# Patient Record
Sex: Female | Born: 1985 | Race: White | Hispanic: No | Marital: Single | Smoking: Current every day smoker
Health system: Southern US, Community
[De-identification: ages and names within clinical notes are randomized; demographics above are authoritative.]

## PROBLEM LIST (undated history)

## (undated) DIAGNOSIS — D649 Anemia, unspecified: Secondary | ICD-10-CM

## (undated) DIAGNOSIS — B159 Hepatitis A without hepatic coma: Secondary | ICD-10-CM

## (undated) DIAGNOSIS — E039 Hypothyroidism, unspecified: Secondary | ICD-10-CM

## (undated) DIAGNOSIS — N760 Acute vaginitis: Secondary | ICD-10-CM

## (undated) DIAGNOSIS — I82409 Acute embolism and thrombosis of unspecified deep veins of unspecified lower extremity: Secondary | ICD-10-CM

## (undated) DIAGNOSIS — K8020 Calculus of gallbladder without cholecystitis without obstruction: Secondary | ICD-10-CM

## (undated) DIAGNOSIS — M797 Fibromyalgia: Secondary | ICD-10-CM

## (undated) DIAGNOSIS — F419 Anxiety disorder, unspecified: Secondary | ICD-10-CM

## (undated) DIAGNOSIS — E079 Disorder of thyroid, unspecified: Secondary | ICD-10-CM

## (undated) DIAGNOSIS — G43009 Migraine without aura, not intractable, without status migrainosus: Secondary | ICD-10-CM

## (undated) DIAGNOSIS — O26879 Cervical shortening, unspecified trimester: Secondary | ICD-10-CM

## (undated) DIAGNOSIS — B9689 Other specified bacterial agents as the cause of diseases classified elsewhere: Secondary | ICD-10-CM

## (undated) DIAGNOSIS — Z8249 Family history of ischemic heart disease and other diseases of the circulatory system: Secondary | ICD-10-CM

## (undated) HISTORY — DX: Migraine without aura, not intractable, without status migrainosus: G43.009

## (undated) HISTORY — DX: Cervical shortening, unspecified trimester: O26.879

## (undated) HISTORY — DX: Calculus of gallbladder without cholecystitis without obstruction: K80.20

## (undated) HISTORY — PX: CHOLECYSTECTOMY, LAPAROSCOPIC: SHX56

## (undated) HISTORY — PX: CHOLECYSTECTOMY: SHX55

## (undated) HISTORY — DX: Family history of ischemic heart disease and other diseases of the circulatory system: Z82.49

## (undated) HISTORY — PX: WISDOM TOOTH EXTRACTION: SHX21

## (undated) HISTORY — DX: Anemia, unspecified: D64.9

---
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
## 2004-02-18 ENCOUNTER — Emergency Department (HOSPITAL_COMMUNITY): Admission: EM | Admit: 2004-02-18 | Discharge: 2004-02-18 | Payer: Self-pay

## 2005-07-18 ENCOUNTER — Emergency Department (HOSPITAL_COMMUNITY): Admission: EM | Admit: 2005-07-18 | Discharge: 2005-07-18 | Payer: Self-pay | Admitting: Emergency Medicine

## 2005-11-07 ENCOUNTER — Inpatient Hospital Stay (HOSPITAL_COMMUNITY): Admission: AD | Admit: 2005-11-07 | Discharge: 2005-11-07 | Payer: Self-pay | Admitting: Obstetrics and Gynecology

## 2005-11-28 ENCOUNTER — Inpatient Hospital Stay (HOSPITAL_COMMUNITY): Admission: AD | Admit: 2005-11-28 | Discharge: 2005-11-28 | Payer: Self-pay | Admitting: *Deleted

## 2006-01-15 ENCOUNTER — Encounter (INDEPENDENT_AMBULATORY_CARE_PROVIDER_SITE_OTHER): Payer: Self-pay | Admitting: *Deleted

## 2006-01-15 ENCOUNTER — Ambulatory Visit: Payer: Self-pay | Admitting: Family Medicine

## 2006-01-15 ENCOUNTER — Other Ambulatory Visit: Admission: RE | Admit: 2006-01-15 | Discharge: 2006-01-15 | Payer: Self-pay | Admitting: Family Medicine

## 2006-01-18 ENCOUNTER — Inpatient Hospital Stay (HOSPITAL_COMMUNITY): Admission: AD | Admit: 2006-01-18 | Discharge: 2006-01-18 | Payer: Self-pay | Admitting: Obstetrics and Gynecology

## 2006-02-04 ENCOUNTER — Ambulatory Visit (HOSPITAL_COMMUNITY): Admission: RE | Admit: 2006-02-04 | Discharge: 2006-02-04 | Payer: Self-pay | Admitting: Obstetrics and Gynecology

## 2006-02-14 ENCOUNTER — Inpatient Hospital Stay (HOSPITAL_COMMUNITY): Admission: AD | Admit: 2006-02-14 | Discharge: 2006-02-14 | Payer: Self-pay | Admitting: Obstetrics and Gynecology

## 2006-02-14 ENCOUNTER — Ambulatory Visit: Payer: Self-pay | Admitting: Obstetrics and Gynecology

## 2006-03-03 ENCOUNTER — Ambulatory Visit: Payer: Self-pay | Admitting: Certified Nurse Midwife

## 2006-03-03 ENCOUNTER — Inpatient Hospital Stay (HOSPITAL_COMMUNITY): Admission: AD | Admit: 2006-03-03 | Discharge: 2006-03-03 | Payer: Self-pay | Admitting: Family Medicine

## 2006-03-19 ENCOUNTER — Ambulatory Visit: Payer: Self-pay | Admitting: *Deleted

## 2006-04-02 ENCOUNTER — Ambulatory Visit: Payer: Self-pay | Admitting: Gynecology

## 2006-04-23 ENCOUNTER — Ambulatory Visit: Payer: Self-pay | Admitting: Gynecology

## 2006-05-07 ENCOUNTER — Ambulatory Visit: Payer: Self-pay | Admitting: Family Medicine

## 2006-05-08 ENCOUNTER — Inpatient Hospital Stay (HOSPITAL_COMMUNITY): Admission: AD | Admit: 2006-05-08 | Discharge: 2006-05-08 | Payer: Self-pay | Admitting: Obstetrics & Gynecology

## 2006-05-08 ENCOUNTER — Ambulatory Visit: Payer: Self-pay | Admitting: *Deleted

## 2006-05-21 ENCOUNTER — Ambulatory Visit: Payer: Self-pay | Admitting: Gynecology

## 2006-06-01 ENCOUNTER — Ambulatory Visit: Payer: Self-pay | Admitting: Family Medicine

## 2006-06-15 ENCOUNTER — Ambulatory Visit: Payer: Self-pay | Admitting: Obstetrics & Gynecology

## 2006-06-22 ENCOUNTER — Ambulatory Visit: Payer: Self-pay | Admitting: Family Medicine

## 2006-06-23 ENCOUNTER — Inpatient Hospital Stay (HOSPITAL_COMMUNITY): Admission: AD | Admit: 2006-06-23 | Discharge: 2006-06-25 | Payer: Self-pay | Admitting: Obstetrics and Gynecology

## 2006-09-02 ENCOUNTER — Encounter (INDEPENDENT_AMBULATORY_CARE_PROVIDER_SITE_OTHER): Payer: Self-pay | Admitting: Specialist

## 2006-09-02 ENCOUNTER — Ambulatory Visit (HOSPITAL_COMMUNITY): Admission: RE | Admit: 2006-09-02 | Discharge: 2006-09-02 | Payer: Self-pay

## 2008-01-10 ENCOUNTER — Emergency Department (HOSPITAL_COMMUNITY): Admission: EM | Admit: 2008-01-10 | Discharge: 2008-01-10 | Payer: Self-pay | Admitting: Emergency Medicine

## 2008-01-28 ENCOUNTER — Encounter (INDEPENDENT_AMBULATORY_CARE_PROVIDER_SITE_OTHER): Payer: Self-pay | Admitting: Internal Medicine

## 2008-01-28 ENCOUNTER — Ambulatory Visit: Payer: Self-pay | Admitting: Family Medicine

## 2008-01-28 LAB — CONVERTED CEMR LAB
ALT: 34 units/L (ref 0–35)
AST: 19 units/L (ref 0–37)
Albumin: 4.5 g/dL (ref 3.5–5.2)
Alkaline Phosphatase: 102 units/L (ref 39–117)
BUN: 8 mg/dL (ref 6–23)
Basophils Absolute: 0 10*3/uL (ref 0.0–0.1)
Basophils Relative: 0 % (ref 0–1)
CO2: 23 meq/L (ref 19–32)
Calcium: 9.4 mg/dL (ref 8.4–10.5)
Chloride: 105 meq/L (ref 96–112)
Cholesterol: 131 mg/dL (ref 0–200)
Creatinine, Ser: 0.76 mg/dL (ref 0.40–1.20)
Eosinophils Absolute: 0.2 10*3/uL (ref 0.0–0.7)
Eosinophils Relative: 3 % (ref 0–5)
Glucose, Bld: 81 mg/dL (ref 70–99)
HCT: 42.2 % (ref 36.0–46.0)
HDL: 25 mg/dL — ABNORMAL LOW (ref >39)
Hemoglobin: 14.1 g/dL (ref 12.0–15.0)
LDL Cholesterol: 82 mg/dL (ref 0–99)
Lymphocytes Relative: 40 % (ref 12–46)
Lymphs Abs: 2.8 10*3/uL (ref 0.7–4.0)
MCHC: 33.4 g/dL (ref 30.0–36.0)
MCV: 88.5 fL (ref 78.0–100.0)
Microalb, Ur: 1.8 mg/dL (ref 0.00–1.89)
Monocytes Absolute: 0.4 10*3/uL (ref 0.1–1.0)
Monocytes Relative: 6 % (ref 3–12)
Neutro Abs: 3.5 10*3/uL (ref 1.7–7.7)
Neutrophils Relative %: 50 % (ref 43–77)
Platelets: 333 10*3/uL (ref 150–400)
Potassium: 3.9 meq/L (ref 3.5–5.3)
RBC: 4.77 M/uL (ref 3.87–5.11)
RDW: 13.5 % (ref 11.5–15.5)
Sodium: 141 meq/L (ref 135–145)
Total Bilirubin: 0.4 mg/dL (ref 0.3–1.2)
Total CHOL/HDL Ratio: 5.2
Total Protein: 7.4 g/dL (ref 6.0–8.3)
Triglycerides: 118 mg/dL (ref <150)
VLDL: 24 mg/dL (ref 0–40)
WBC: 7 10*3/uL (ref 4.0–10.5)

## 2008-01-31 ENCOUNTER — Ambulatory Visit: Payer: Self-pay | Admitting: *Deleted

## 2008-02-16 ENCOUNTER — Ambulatory Visit: Payer: Self-pay | Admitting: Gynecology

## 2008-03-02 ENCOUNTER — Other Ambulatory Visit: Admission: RE | Admit: 2008-03-02 | Discharge: 2008-03-02 | Payer: Self-pay | Admitting: Obstetrics & Gynecology

## 2008-03-02 ENCOUNTER — Ambulatory Visit: Payer: Self-pay | Admitting: Obstetrics & Gynecology

## 2008-03-16 ENCOUNTER — Ambulatory Visit: Payer: Self-pay | Admitting: Family Medicine

## 2008-06-22 ENCOUNTER — Ambulatory Visit: Payer: Self-pay | Admitting: Internal Medicine

## 2008-06-23 ENCOUNTER — Encounter: Payer: Self-pay | Admitting: Internal Medicine

## 2008-09-21 ENCOUNTER — Emergency Department (HOSPITAL_COMMUNITY): Admission: EM | Admit: 2008-09-21 | Discharge: 2008-09-21 | Payer: Self-pay | Admitting: Family Medicine

## 2008-12-04 ENCOUNTER — Emergency Department (HOSPITAL_BASED_OUTPATIENT_CLINIC_OR_DEPARTMENT_OTHER): Admission: EM | Admit: 2008-12-04 | Discharge: 2008-12-04 | Payer: Self-pay | Admitting: Emergency Medicine

## 2008-12-04 ENCOUNTER — Ambulatory Visit: Payer: Self-pay | Admitting: Diagnostic Radiology

## 2008-12-08 ENCOUNTER — Emergency Department (HOSPITAL_COMMUNITY): Admission: EM | Admit: 2008-12-08 | Discharge: 2008-12-08 | Payer: Self-pay | Admitting: Family Medicine

## 2008-12-28 ENCOUNTER — Ambulatory Visit: Payer: Self-pay | Admitting: Obstetrics and Gynecology

## 2008-12-28 ENCOUNTER — Encounter: Payer: Self-pay | Admitting: Obstetrics and Gynecology

## 2009-06-26 ENCOUNTER — Emergency Department (HOSPITAL_BASED_OUTPATIENT_CLINIC_OR_DEPARTMENT_OTHER): Admission: EM | Admit: 2009-06-26 | Discharge: 2009-06-26 | Payer: Self-pay | Admitting: Emergency Medicine

## 2009-07-31 ENCOUNTER — Emergency Department: Payer: Self-pay | Admitting: Emergency Medicine

## 2009-08-01 ENCOUNTER — Emergency Department (HOSPITAL_BASED_OUTPATIENT_CLINIC_OR_DEPARTMENT_OTHER): Admission: EM | Admit: 2009-08-01 | Discharge: 2009-08-01 | Payer: Self-pay | Admitting: Emergency Medicine

## 2009-08-08 ENCOUNTER — Emergency Department (HOSPITAL_BASED_OUTPATIENT_CLINIC_OR_DEPARTMENT_OTHER): Admission: EM | Admit: 2009-08-08 | Discharge: 2009-08-08 | Payer: Self-pay | Admitting: Emergency Medicine

## 2009-08-17 ENCOUNTER — Emergency Department (HOSPITAL_BASED_OUTPATIENT_CLINIC_OR_DEPARTMENT_OTHER): Admission: EM | Admit: 2009-08-17 | Discharge: 2009-08-17 | Payer: Self-pay | Admitting: Emergency Medicine

## 2009-09-25 ENCOUNTER — Emergency Department (HOSPITAL_BASED_OUTPATIENT_CLINIC_OR_DEPARTMENT_OTHER): Admission: EM | Admit: 2009-09-25 | Discharge: 2009-09-26 | Payer: Self-pay | Admitting: Emergency Medicine

## 2009-09-26 ENCOUNTER — Ambulatory Visit: Payer: Self-pay | Admitting: Radiology

## 2009-11-03 ENCOUNTER — Ambulatory Visit: Payer: Self-pay | Admitting: Family

## 2009-11-03 ENCOUNTER — Inpatient Hospital Stay (HOSPITAL_COMMUNITY): Admission: AD | Admit: 2009-11-03 | Discharge: 2009-11-04 | Payer: Self-pay | Admitting: Obstetrics and Gynecology

## 2010-06-12 ENCOUNTER — Emergency Department (HOSPITAL_BASED_OUTPATIENT_CLINIC_OR_DEPARTMENT_OTHER): Admission: EM | Admit: 2010-06-12 | Discharge: 2010-06-12 | Payer: Self-pay | Admitting: Emergency Medicine

## 2010-06-30 ENCOUNTER — Emergency Department (HOSPITAL_COMMUNITY): Admission: EM | Admit: 2010-06-30 | Discharge: 2010-07-01 | Payer: Self-pay | Admitting: Emergency Medicine

## 2010-08-14 ENCOUNTER — Emergency Department (HOSPITAL_BASED_OUTPATIENT_CLINIC_OR_DEPARTMENT_OTHER): Admission: EM | Admit: 2010-08-14 | Discharge: 2010-08-14 | Payer: Self-pay | Admitting: Emergency Medicine

## 2010-10-20 ENCOUNTER — Emergency Department: Payer: Self-pay | Admitting: Emergency Medicine

## 2010-11-15 ENCOUNTER — Inpatient Hospital Stay (HOSPITAL_COMMUNITY)
Admission: AD | Admit: 2010-11-15 | Discharge: 2010-11-16 | Payer: Self-pay | Source: Home / Self Care | Attending: Obstetrics & Gynecology | Admitting: Obstetrics & Gynecology

## 2010-12-08 NOTE — L&D Delivery Note (Signed)
Delivery Note At 2:08 AM a viable and healthy female was delivered via Vaginal, Spontaneous Delivery (Presentation: Left Occiput Anterior).  APGAR: 9, 9; weight 9 lb 11 oz (4395 g).   Placenta status: Intact, Spontaneous.  Cord: 3 vessels with the following complications: None.  Cord pH: n/a  Anesthesia: Epidural  Episiotomy: None Lacerations: 2nd degree Suture Repair: 3.0 Monocryl Est. Blood Loss (mL): 450  Mom to postpartum.  Baby to nursery-stable.  [facility] 07/05/2011, 3:16 AM

## 2010-12-13 ENCOUNTER — Emergency Department (HOSPITAL_COMMUNITY)
Admission: EM | Admit: 2010-12-13 | Discharge: 2010-12-14 | Payer: Self-pay | Source: Home / Self Care | Admitting: Emergency Medicine

## 2010-12-15 ENCOUNTER — Emergency Department: Payer: Self-pay | Admitting: Internal Medicine

## 2010-12-23 LAB — POCT I-STAT, CHEM 8
BUN: 5 mg/dL — ABNORMAL LOW (ref 6–23)
Calcium, Ion: 1.21 mmol/L (ref 1.12–1.32)
Chloride: 104 mEq/L (ref 96–112)
Creatinine, Ser: 0.7 mg/dL (ref 0.4–1.2)
Glucose, Bld: 90 mg/dL (ref 70–99)
HCT: 35 % — ABNORMAL LOW (ref 36.0–46.0)
Hemoglobin: 11.9 g/dL — ABNORMAL LOW (ref 12.0–15.0)
Potassium: 4.2 mEq/L (ref 3.5–5.1)
Sodium: 138 mEq/L (ref 135–145)
TCO2: 27 mmol/L (ref 0–100)

## 2010-12-27 ENCOUNTER — Emergency Department: Payer: Self-pay | Admitting: Emergency Medicine

## 2011-01-16 ENCOUNTER — Encounter (INDEPENDENT_AMBULATORY_CARE_PROVIDER_SITE_OTHER): Payer: Self-pay | Admitting: *Deleted

## 2011-01-16 DIAGNOSIS — Z3480 Encounter for supervision of other normal pregnancy, unspecified trimester: Secondary | ICD-10-CM

## 2011-01-16 LAB — CONVERTED CEMR LAB
Antibody Screen: NEGATIVE
Basophils Absolute: 0 10*3/uL (ref 0.0–0.1)
Basophils Relative: 0 % (ref 0–1)
Eosinophils Absolute: 0.3 10*3/uL (ref 0.0–0.7)
Eosinophils Relative: 3 % (ref 0–5)
HCT: 32.4 % — ABNORMAL LOW (ref 36.0–46.0)
HIV: NONREACTIVE
Hemoglobin: 11 g/dL — ABNORMAL LOW (ref 12.0–15.0)
Hepatitis B Surface Ag: NEGATIVE
Lymphocytes Relative: 34 % (ref 12–46)
Lymphs Abs: 2.7 10*3/uL (ref 0.7–4.0)
MCHC: 34 g/dL (ref 30.0–36.0)
MCV: 91.5 fL (ref 78.0–100.0)
Monocytes Absolute: 0.5 10*3/uL (ref 0.1–1.0)
Monocytes Relative: 7 % (ref 3–12)
Neutro Abs: 4.5 10*3/uL (ref 1.7–7.7)
Neutrophils Relative %: 56 % (ref 43–77)
Platelets: 247 10*3/uL (ref 150–400)
RBC: 3.54 M/uL — ABNORMAL LOW (ref 3.87–5.11)
RDW: 13.8 % (ref 11.5–15.5)
Rh Type: POSITIVE
Rubella: 226.2 intl units/mL — ABNORMAL HIGH
WBC: 8 10*3/uL (ref 4.0–10.5)

## 2011-01-20 ENCOUNTER — Other Ambulatory Visit: Payer: Self-pay | Admitting: Family Medicine

## 2011-01-20 DIAGNOSIS — Z3689 Encounter for other specified antenatal screening: Secondary | ICD-10-CM

## 2011-01-27 ENCOUNTER — Emergency Department: Payer: Self-pay | Admitting: Emergency Medicine

## 2011-01-27 ENCOUNTER — Other Ambulatory Visit (HOSPITAL_COMMUNITY)
Admission: RE | Admit: 2011-01-27 | Discharge: 2011-01-27 | Disposition: A | Discharge status: Home / Self Care | Payer: Medicaid Other | Source: Ambulatory Visit | Attending: Obstetrics and Gynecology | Admitting: Obstetrics and Gynecology

## 2011-01-27 ENCOUNTER — Encounter: Payer: Medicaid Other | Admitting: Obstetrics & Gynecology

## 2011-01-27 DIAGNOSIS — Z01419 Encounter for gynecological examination (general) (routine) without abnormal findings: Secondary | ICD-10-CM | POA: Insufficient documentation

## 2011-01-27 DIAGNOSIS — Z3480 Encounter for supervision of other normal pregnancy, unspecified trimester: Secondary | ICD-10-CM

## 2011-01-27 DIAGNOSIS — Z113 Encounter for screening for infections with a predominantly sexual mode of transmission: Secondary | ICD-10-CM | POA: Insufficient documentation

## 2011-01-29 ENCOUNTER — Encounter: Payer: Self-pay | Admitting: Obstetrics & Gynecology

## 2011-02-10 ENCOUNTER — Encounter (HOSPITAL_COMMUNITY): Payer: Self-pay

## 2011-02-10 ENCOUNTER — Ambulatory Visit (HOSPITAL_COMMUNITY)
Admission: RE | Admit: 2011-02-10 | Discharge: 2011-02-10 | Disposition: A | Discharge status: Home / Self Care | Payer: Medicaid Other | Source: Ambulatory Visit | Attending: Family Medicine | Admitting: Family Medicine

## 2011-02-10 DIAGNOSIS — Z1389 Encounter for screening for other disorder: Secondary | ICD-10-CM | POA: Insufficient documentation

## 2011-02-10 DIAGNOSIS — Z3689 Encounter for other specified antenatal screening: Secondary | ICD-10-CM

## 2011-02-10 DIAGNOSIS — O358XX0 Maternal care for other (suspected) fetal abnormality and damage, not applicable or unspecified: Secondary | ICD-10-CM | POA: Insufficient documentation

## 2011-02-10 DIAGNOSIS — Z363 Encounter for antenatal screening for malformations: Secondary | ICD-10-CM | POA: Insufficient documentation

## 2011-02-17 LAB — URINE CULTURE
Colony Count: >=100000
Culture  Setup Time: 201112100454

## 2011-02-17 LAB — URINALYSIS, ROUTINE W REFLEX MICROSCOPIC
Bilirubin Urine: NEGATIVE
Glucose, UA: NEGATIVE mg/dL
Hgb urine dipstick: NEGATIVE
Ketones, ur: NEGATIVE mg/dL
Leukocytes, UA: NEGATIVE
Nitrite: POSITIVE — AB
Protein, ur: NEGATIVE mg/dL
Specific Gravity, Urine: 1.025 (ref 1.005–1.030)
Urobilinogen, UA: 0.2 mg/dL (ref 0.0–1.0)
pH: 6 (ref 5.0–8.0)

## 2011-02-17 LAB — CBC
HCT: 33.1 % — ABNORMAL LOW (ref 36.0–46.0)
RBC: 3.64 MIL/uL — ABNORMAL LOW (ref 3.87–5.11)
RDW: 13.2 % (ref 11.5–15.5)
WBC: 10.8 10*3/uL — ABNORMAL HIGH (ref 4.0–10.5)

## 2011-02-17 LAB — POCT PREGNANCY, URINE
Preg Test, Ur: POSITIVE
Preg Test, Ur: POSITIVE

## 2011-02-17 LAB — GC/CHLAMYDIA PROBE AMP, URINE: Chlamydia, Swab/Urine, PCR: NEGATIVE

## 2011-02-17 LAB — URINE MICROSCOPIC-ADD ON

## 2011-02-20 LAB — COMPREHENSIVE METABOLIC PANEL
AST: 18 U/L (ref 0–37)
Albumin: 4.5 g/dL (ref 3.5–5.2)
Alkaline Phosphatase: 127 U/L — ABNORMAL HIGH (ref 39–117)
BUN: 15 mg/dL (ref 6–23)
CO2: 23 mEq/L (ref 19–32)
Chloride: 108 mEq/L (ref 96–112)
GFR calc Af Amer: >60 mL/min (ref >60)
Potassium: 4 mEq/L (ref 3.5–5.1)
Total Bilirubin: 0.9 mg/dL (ref 0.3–1.2)

## 2011-02-20 LAB — CBC
Hemoglobin: 14.1 g/dL (ref 12.0–15.0)
MCV: 89 fL (ref 78.0–100.0)
Platelets: 245 10*3/uL (ref 150–400)
RBC: 4.5 MIL/uL (ref 3.87–5.11)
WBC: 7 10*3/uL (ref 4.0–10.5)

## 2011-02-20 LAB — URINALYSIS, ROUTINE W REFLEX MICROSCOPIC
Glucose, UA: NEGATIVE mg/dL
Hgb urine dipstick: NEGATIVE
Ketones, ur: 40 mg/dL — AB
Protein, ur: NEGATIVE mg/dL

## 2011-02-20 LAB — DIFFERENTIAL
Basophils Absolute: 0 10*3/uL (ref 0.0–0.1)
Basophils Relative: 0 % (ref 0–1)
Eosinophils Relative: 1 % (ref 0–5)
Monocytes Absolute: 1.1 10*3/uL — ABNORMAL HIGH (ref 0.1–1.0)

## 2011-02-22 LAB — GC/CHLAMYDIA PROBE AMP, GENITAL
Chlamydia, DNA Probe: NEGATIVE
GC Probe Amp, Genital: NEGATIVE

## 2011-02-22 LAB — URINALYSIS, ROUTINE W REFLEX MICROSCOPIC
Bilirubin Urine: NEGATIVE
Glucose, UA: NEGATIVE mg/dL
Hgb urine dipstick: NEGATIVE
Ketones, ur: NEGATIVE mg/dL
pH: 5.5 (ref 5.0–8.0)

## 2011-02-22 LAB — WET PREP, GENITAL
Clue Cells Wet Prep HPF POC: NONE SEEN
Trich, Wet Prep: NONE SEEN

## 2011-02-25 ENCOUNTER — Encounter: Payer: Medicaid Other | Admitting: Obstetrics and Gynecology

## 2011-02-25 ENCOUNTER — Encounter: Payer: Self-pay | Admitting: Obstetrics & Gynecology

## 2011-02-25 DIAGNOSIS — O99019 Anemia complicating pregnancy, unspecified trimester: Secondary | ICD-10-CM

## 2011-02-25 DIAGNOSIS — Z3480 Encounter for supervision of other normal pregnancy, unspecified trimester: Secondary | ICD-10-CM

## 2011-02-25 LAB — CONVERTED CEMR LAB
ALT: 12 units/L (ref 0–35)
Amylase: 63 units/L (ref 0–105)
CO2: 22 meq/L (ref 19–32)
Creatinine, Ser: 0.51 mg/dL (ref 0.40–1.20)
Lipase: 11 units/L (ref 0–75)
Total Bilirubin: 0.3 mg/dL (ref 0.3–1.2)

## 2011-03-12 LAB — URINALYSIS, ROUTINE W REFLEX MICROSCOPIC
Hgb urine dipstick: NEGATIVE
Nitrite: NEGATIVE
Specific Gravity, Urine: <1.005 — ABNORMAL LOW (ref 1.005–1.030)
Urobilinogen, UA: 0.2 mg/dL (ref 0.0–1.0)

## 2011-03-12 LAB — POCT PREGNANCY, URINE: Preg Test, Ur: NEGATIVE

## 2011-03-13 LAB — URINALYSIS, ROUTINE W REFLEX MICROSCOPIC
Bilirubin Urine: NEGATIVE
Glucose, UA: NEGATIVE mg/dL
Hgb urine dipstick: NEGATIVE
Ketones, ur: NEGATIVE mg/dL
Leukocytes, UA: NEGATIVE
Nitrite: NEGATIVE
Protein, ur: 100 mg/dL — AB
Specific Gravity, Urine: 1.018 (ref 1.005–1.030)
Urobilinogen, UA: 0.2 mg/dL (ref 0.0–1.0)
pH: 6 (ref 5.0–8.0)

## 2011-03-13 LAB — GC/CHLAMYDIA PROBE AMP, GENITAL
Chlamydia, DNA Probe: NEGATIVE
GC Probe Amp, Genital: NEGATIVE

## 2011-03-13 LAB — WET PREP, GENITAL
Trich, Wet Prep: NONE SEEN
WBC, Wet Prep HPF POC: NONE SEEN
Yeast Wet Prep HPF POC: NONE SEEN

## 2011-03-13 LAB — URINE MICROSCOPIC-ADD ON

## 2011-03-13 LAB — PREGNANCY, URINE: Preg Test, Ur: NEGATIVE

## 2011-03-14 LAB — URINE MICROSCOPIC-ADD ON

## 2011-03-14 LAB — URINALYSIS, ROUTINE W REFLEX MICROSCOPIC
Bilirubin Urine: NEGATIVE
Glucose, UA: NEGATIVE mg/dL
Hgb urine dipstick: NEGATIVE
Ketones, ur: 15 mg/dL — AB
Ketones, ur: NEGATIVE mg/dL
Nitrite: NEGATIVE
Protein, ur: 30 mg/dL — AB
Protein, ur: NEGATIVE mg/dL
Urobilinogen, UA: 0.2 mg/dL (ref 0.0–1.0)

## 2011-03-14 LAB — PREGNANCY, URINE
Preg Test, Ur: NEGATIVE
Preg Test, Ur: NEGATIVE

## 2011-03-14 LAB — URINE CULTURE
Colony Count: NO GROWTH
Culture: NO GROWTH

## 2011-03-15 LAB — URINE CULTURE

## 2011-03-15 LAB — URINALYSIS, ROUTINE W REFLEX MICROSCOPIC
Nitrite: POSITIVE — AB
Protein, ur: NEGATIVE mg/dL
Specific Gravity, Urine: 1.015 (ref 1.005–1.030)
Urobilinogen, UA: 0.2 mg/dL (ref 0.0–1.0)

## 2011-03-15 LAB — URINE MICROSCOPIC-ADD ON

## 2011-03-16 LAB — URINALYSIS, ROUTINE W REFLEX MICROSCOPIC
Hgb urine dipstick: NEGATIVE
Nitrite: NEGATIVE
Protein, ur: NEGATIVE mg/dL
Specific Gravity, Urine: 1.011 (ref 1.005–1.030)
Urobilinogen, UA: 1 mg/dL (ref 0.0–1.0)

## 2011-03-16 LAB — PREGNANCY, URINE: Preg Test, Ur: NEGATIVE

## 2011-03-16 LAB — URINE MICROSCOPIC-ADD ON

## 2011-03-25 ENCOUNTER — Other Ambulatory Visit: Payer: Self-pay | Admitting: Obstetrics and Gynecology

## 2011-03-25 ENCOUNTER — Encounter: Payer: Medicaid Other | Admitting: Obstetrics and Gynecology

## 2011-03-25 DIAGNOSIS — Z3480 Encounter for supervision of other normal pregnancy, unspecified trimester: Secondary | ICD-10-CM

## 2011-03-25 DIAGNOSIS — O26879 Cervical shortening, unspecified trimester: Secondary | ICD-10-CM

## 2011-03-28 ENCOUNTER — Inpatient Hospital Stay (HOSPITAL_COMMUNITY)
Admission: AD | Admit: 2011-03-28 | Discharge: 2011-03-28 | Disposition: A | Discharge status: Home / Self Care | Payer: Medicaid Other | Source: Ambulatory Visit | Attending: Obstetrics and Gynecology | Admitting: Obstetrics and Gynecology

## 2011-03-28 ENCOUNTER — Ambulatory Visit (HOSPITAL_COMMUNITY)
Admission: RE | Admit: 2011-03-28 | Discharge: 2011-03-28 | Disposition: A | Discharge status: Home / Self Care | Payer: Medicaid Other | Source: Ambulatory Visit | Attending: Obstetrics and Gynecology | Admitting: Obstetrics and Gynecology

## 2011-03-28 DIAGNOSIS — O26879 Cervical shortening, unspecified trimester: Secondary | ICD-10-CM

## 2011-03-28 DIAGNOSIS — Z3689 Encounter for other specified antenatal screening: Secondary | ICD-10-CM | POA: Insufficient documentation

## 2011-03-28 HISTORY — DX: Cervical shortening, unspecified trimester: O26.879

## 2011-03-30 ENCOUNTER — Emergency Department (HOSPITAL_BASED_OUTPATIENT_CLINIC_OR_DEPARTMENT_OTHER)
Admission: EM | Admit: 2011-03-30 | Discharge: 2011-03-30 | Disposition: A | Discharge status: Home / Self Care | Payer: Medicaid Other | Attending: Emergency Medicine | Admitting: Emergency Medicine

## 2011-03-30 DIAGNOSIS — R0789 Other chest pain: Secondary | ICD-10-CM | POA: Insufficient documentation

## 2011-03-30 DIAGNOSIS — R109 Unspecified abdominal pain: Secondary | ICD-10-CM | POA: Insufficient documentation

## 2011-03-30 LAB — DIFFERENTIAL
Basophils Absolute: 0 K/uL (ref 0.0–0.1)
Basophils Relative: 0 % (ref 0–1)
Eosinophils Absolute: 0.5 10*3/uL (ref 0.0–0.7)
Eosinophils Relative: 4 % (ref 0–5)
Lymphocytes Relative: 30 % (ref 12–46)
Lymphs Abs: 4 10*3/uL (ref 0.7–4.0)
Monocytes Absolute: 1.1 10*3/uL — ABNORMAL HIGH (ref 0.1–1.0)
Monocytes Relative: 8 % (ref 3–12)
Neutro Abs: 8 K/uL — ABNORMAL HIGH (ref 1.7–7.7)
Neutrophils Relative %: 59 % (ref 43–77)

## 2011-03-30 LAB — URINALYSIS, ROUTINE W REFLEX MICROSCOPIC
Bilirubin Urine: NEGATIVE
Glucose, UA: NEGATIVE mg/dL
Hgb urine dipstick: NEGATIVE
Ketones, ur: NEGATIVE mg/dL
Nitrite: NEGATIVE
Protein, ur: NEGATIVE mg/dL
Specific Gravity, Urine: 1.01 (ref 1.005–1.030)
Urobilinogen, UA: 0.2 mg/dL (ref 0.0–1.0)
pH: 6.5 (ref 5.0–8.0)

## 2011-03-30 LAB — CBC
HCT: 34 % — ABNORMAL LOW (ref 36.0–46.0)
Hemoglobin: 12 g/dL (ref 12.0–15.0)
MCH: 32.1 pg (ref 26.0–34.0)
MCHC: 35.3 g/dL (ref 30.0–36.0)
MCV: 90.9 fL (ref 78.0–100.0)
Platelets: 232 10*3/uL (ref 150–400)
RBC: 3.74 MIL/uL — ABNORMAL LOW (ref 3.87–5.11)
RDW: 13.3 % (ref 11.5–15.5)
WBC: 13.6 K/uL — ABNORMAL HIGH (ref 4.0–10.5)

## 2011-03-30 LAB — LIPASE, BLOOD: Lipase: 83 U/L (ref 23–300)

## 2011-03-30 LAB — COMPREHENSIVE METABOLIC PANEL WITH GFR
ALT: <6 U/L (ref 0–35)
AST: 14 U/L (ref 0–37)
Albumin: 3.5 g/dL (ref 3.5–5.2)
Alkaline Phosphatase: 102 U/L (ref 39–117)
Calcium: 9.1 mg/dL (ref 8.4–10.5)
GFR calc Af Amer: >60 mL/min (ref >60)
Glucose, Bld: 84 mg/dL (ref 70–99)
Potassium: 4 meq/L (ref 3.5–5.1)
Sodium: 141 meq/L (ref 135–145)
Total Protein: 6.8 g/dL (ref 6.0–8.3)

## 2011-03-30 LAB — COMPREHENSIVE METABOLIC PANEL
BUN: 8 mg/dL (ref 6–23)
CO2: 24 mEq/L (ref 19–32)
Chloride: 106 mEq/L (ref 96–112)
Creatinine, Ser: 0.5 mg/dL (ref 0.4–1.2)
GFR calc non Af Amer: >60 mL/min (ref >60)
Total Bilirubin: 0.3 mg/dL (ref 0.3–1.2)

## 2011-04-03 ENCOUNTER — Other Ambulatory Visit: Payer: Self-pay | Admitting: Obstetrics and Gynecology

## 2011-04-03 ENCOUNTER — Encounter (INDEPENDENT_AMBULATORY_CARE_PROVIDER_SITE_OTHER): Payer: Medicaid Other | Admitting: Obstetrics & Gynecology

## 2011-04-03 DIAGNOSIS — K8689 Other specified diseases of pancreas: Secondary | ICD-10-CM

## 2011-04-03 DIAGNOSIS — O99019 Anemia complicating pregnancy, unspecified trimester: Secondary | ICD-10-CM

## 2011-04-03 DIAGNOSIS — O3430 Maternal care for cervical incompetence, unspecified trimester: Secondary | ICD-10-CM

## 2011-04-03 DIAGNOSIS — Z3480 Encounter for supervision of other normal pregnancy, unspecified trimester: Secondary | ICD-10-CM

## 2011-04-08 ENCOUNTER — Ambulatory Visit (HOSPITAL_COMMUNITY)
Admission: RE | Admit: 2011-04-08 | Discharge: 2011-04-08 | Disposition: A | Discharge status: Home / Self Care | Payer: Medicaid Other | Source: Ambulatory Visit | Attending: Obstetrics and Gynecology | Admitting: Obstetrics and Gynecology

## 2011-04-08 DIAGNOSIS — O3430 Maternal care for cervical incompetence, unspecified trimester: Secondary | ICD-10-CM | POA: Insufficient documentation

## 2011-04-08 DIAGNOSIS — K8689 Other specified diseases of pancreas: Secondary | ICD-10-CM

## 2011-04-08 DIAGNOSIS — Z3689 Encounter for other specified antenatal screening: Secondary | ICD-10-CM | POA: Insufficient documentation

## 2011-04-21 ENCOUNTER — Encounter (INDEPENDENT_AMBULATORY_CARE_PROVIDER_SITE_OTHER): Payer: Medicaid Other | Admitting: Obstetrics and Gynecology

## 2011-04-21 DIAGNOSIS — Z3480 Encounter for supervision of other normal pregnancy, unspecified trimester: Secondary | ICD-10-CM

## 2011-04-22 ENCOUNTER — Encounter: Payer: Medicaid Other | Admitting: Obstetrics & Gynecology

## 2011-04-22 NOTE — Group Therapy Note (Signed)
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  The report  also reported high-grade dysplasia present at the exocervical surgical  resection margin.  She is in for her 23-month follow up.  Pap smear was  repeated.  We also talked about her smoking.  She has cut back to half a  pack a day from a pack a day.  I talked to her about the methods of  stopping and Wellbutrin.  She has Medicaid, so she is going to see  whether that will cover her.  If so, she will call in, and we will call  in a prescription for her for Wellbutrin.  I described how to take it in  detail.  In addition, she is concerned because she has gained a  significant amount of weight.  She is 5 feet 3 inches and weighs 140  pounds, hardly heavy, but she wants to get back to her BMI at 25.  We  talked about this.  Since I told her that she probably could not stop  the smoking and go on a weight program at the same time, I told her she  might try the over-the-counter Alli and how it worked and what the  precautions would be for vitamins and possible explosive diarrhea.  In  any case, I would think this patient with her history, if she has a  normal Pap smear, we should continue to follow her every 6 months for at  least 2 years.   IMPRESSION:  Severe dysplasia, six months post LEEP.           ______________________________  Argentina Donovan, MD     PR/MEDQ  D:  12/28/2008  T:  12/28/2008  Job:  [id number]

## 2011-04-25 NOTE — Op Note (Signed)
Barbara Moses, Barbara Moses                ACCOUNT NO.:  [id number]   MEDICAL RECORD NO.:  [id number]          PATIENT TYPE:  AMB   LOCATION:  DAY                          FACILITY:  [facility]   PHYSICIAN:  Lebron Conners, M.D.   DATE OF BIRTH:  10-11-1986   DATE OF PROCEDURE:  DATE OF DISCHARGE:  09/02/2006                                 OPERATIVE REPORT   PRE AND POSTOPERATIVE DIAGNOSIS:  Symptomatic gallstones.   OPERATION:  Laparoscopic cholecystectomy.   SURGEON:  Lebron Conners, M.D.   ANESTHESIA:  General and local.   SPECIMEN:  Gallbladder.   BLOOD LOSS:  Minimal.   COMPLICATIONS:  None.   PROCEDURE:  After the patient was monitored and asleep and had routine  preparation and draping of the abdomen, I infiltrated local anesthetic just  below the umbilicus and made a 2 cm transverse incision, then bluntly  dissected through the fat and incised the midline fascia for 2 cm then  bluntly opened the peritoneal cavity.  I placed 0 Vicryl pursestring suture  in the fascia and secured a Hassan cannula and inflated the abdomen with  carbon dioxide.  Laparoscopy disclosed no abnormalities.  The gallbladder  looked normal.  Infiltrated local anesthetic in three additional sites and  placed three more ports under direct view then placed the patient head-up  foot down and left tilted.  I retracted the fundus of the gallbladder toward  the right shoulder and pulled the infundibulum laterally.  There were no  adhesions.  I dissected the hepatoduodenal ligament until I clearly  identified the cystic duct emerging from the infundibulum.  I could see the  common duct medially quite clearly.  I clipped the cystic duct with four  clips and cut between the two closest to the gallbladder.  There was very  small duct and the patient was known to have large gallstones and normal  liver tests and I did not feel a cholangiogram was warranted.  I then  dissected further and identified two branches of  cystic artery branching  onto the gallbladder and clipped and divided those.  I dissected from the  gallbladder from the liver using the cautery.  I accidentally made a small  hole in the gallbladder but no stones were spilled.  I sucked out the bile  and subsequently copiously irrigated and removed the irrigant.  After  detaching the gallbladder from the liver.  I placed it in a plastic pouch  and removed through the umbilical incision and tied the pursestring suture.  The clips appeared to be secure and there was no evident leakage of bile or  bleeding.  I removed the lateral ports under direct view and saw no bleeding  from the belly wall.  I then removed the epigastric port after allowing the  carbon dioxide to escape.  I closed all skin incisions with interrupted  intracuticular 4-0 Vicryl and Steri-Strips.  She tolerated the operation  well.      Lebron Conners, M.D.  Electronically Signed     WB/MEDQ  D:  09/02/2006  T:  09/04/2006  Job:  [id number] 

## 2011-04-30 ENCOUNTER — Encounter: Payer: Self-pay | Admitting: Gastroenterology

## 2011-04-30 ENCOUNTER — Other Ambulatory Visit (INDEPENDENT_AMBULATORY_CARE_PROVIDER_SITE_OTHER): Payer: Medicaid Other

## 2011-04-30 ENCOUNTER — Ambulatory Visit (INDEPENDENT_AMBULATORY_CARE_PROVIDER_SITE_OTHER): Payer: Medicaid Other | Admitting: Gastroenterology

## 2011-04-30 DIAGNOSIS — R109 Unspecified abdominal pain: Secondary | ICD-10-CM

## 2011-04-30 DIAGNOSIS — R079 Chest pain, unspecified: Secondary | ICD-10-CM

## 2011-04-30 LAB — HEPATIC FUNCTION PANEL: Total Bilirubin: 0.4 mg/dL (ref 0.3–1.2)

## 2011-04-30 NOTE — Progress Notes (Signed)
HPI: This is a  very pleasant 25 year old woman who is [redacted] weeks pregnant.  She has pains that go from chest to back, can last for many hours.  Sometimes after eating, sometimes not.  The pains are in chest only, not abdominal.  Same as the pains with gallstones with her first pregnancy.  She has pyrosis, takes tums periodically.  Has not really been taking H2 blocker.    No swallowing.   pregnacny #2 now another son.  Had gallstones with first pregnancy, lap chole a few months after delivery.  This pregnancy is progressing well.    She was in the emergency room about one month ago with an occurrence of these pains. Abdominal ultrasound was normal, status post cholecystectomy. Her bile duct was 2 or 3 cm. CBC and complete metabolic profile were both normal. She tried taking a lot of TUMS and H2 blockers with one of the occurrences of this chest to back pain but noticed no improvement.    Review of systems: Pertinent positive and negative review of systems were noted in the above HPI section.  All other review of systems was otherwise negative.   Past Medical History, Past Surgical History, Family History, Social History, Current Medications, Allergies were all reviewed with the patient via [facility] electronic medical record system.   Physical Exam: BP 98/68  Pulse 100  Ht 5' 2.5" (1.588 m)  Wt 164 lb (74.39 kg)  BMI 29.52 kg/m2 Constitutional: generally well-appearing Psychiatric: alert and oriented x3 Eyes: extraocular movements intact Mouth: oral pharynx moist, no lesions Neck: supple no lymphadenopathy Cardiovascular: heart regular rate and rhythm Lungs: clear to auscultation bilaterally Abdomen: soft, nontender, nondistended, no obvious ascites, no peritoneal signs, normal bowel sounds; appropriately gravid Extremities: no lower extremity edema bilaterally Skin: no lesions on visible extremities    Assessment and plan: 25 y.o. female with intermittent chest pains, [redacted]  weeks pregnant  She is very clear that these chest pains are exactly like the chest pains she was having when she was pregnant with her first son and found to have gallstone disease. She continued to have pains like that after the pregnancy and until her gallbladder was removed.   she may indeed be having recurrent biliary symptoms however her liver tests were normal and her abdominal ultrasound showed a non-dilated bile duct. MRI, MRCP is safe in pregnancy and so we will set that up at her since convenience. She does understand that even if we find that she has bile duct stones, I would be very reluctant to proceed with the ERCP unless absolutely necessary given that she is pregnant. Perhaps her symptoms are GERD related however and he has to medicines have not helped the pains at all. Perhaps her pains are simply related to mass effect from her pregnancy.

## 2011-04-30 NOTE — Patient Instructions (Addendum)
MRCP to check for retained CBD stones.  [facility]  05/01/11  Please arrive at 12:45 pm and have nothing to eat or drink after 9 am. You will have labs checked today in the basement lab.  Please head down after you check out with the front desk  (LFTs). A copy of this information will be made available to Dr. Jolayne Panther.

## 2011-05-03 ENCOUNTER — Ambulatory Visit (HOSPITAL_COMMUNITY)
Admission: RE | Admit: 2011-05-03 | Discharge: 2011-05-03 | Disposition: A | Discharge status: Home / Self Care | Payer: Medicaid Other | Source: Ambulatory Visit | Attending: Gastroenterology | Admitting: Gastroenterology

## 2011-05-03 DIAGNOSIS — O99891 Other specified diseases and conditions complicating pregnancy: Secondary | ICD-10-CM | POA: Insufficient documentation

## 2011-05-03 DIAGNOSIS — R1011 Right upper quadrant pain: Secondary | ICD-10-CM | POA: Insufficient documentation

## 2011-05-12 ENCOUNTER — Encounter: Payer: Medicaid Other | Admitting: Obstetrics and Gynecology

## 2011-05-14 ENCOUNTER — Inpatient Hospital Stay (HOSPITAL_COMMUNITY)
Admission: AD | Admit: 2011-05-14 | Discharge: 2011-05-14 | Disposition: A | Discharge status: Home / Self Care | Payer: Medicaid Other | Source: Ambulatory Visit | Attending: Obstetrics & Gynecology | Admitting: Obstetrics & Gynecology

## 2011-05-14 DIAGNOSIS — W19XXXA Unspecified fall, initial encounter: Secondary | ICD-10-CM | POA: Insufficient documentation

## 2011-05-14 DIAGNOSIS — O99891 Other specified diseases and conditions complicating pregnancy: Secondary | ICD-10-CM | POA: Insufficient documentation

## 2011-05-14 DIAGNOSIS — O9989 Other specified diseases and conditions complicating pregnancy, childbirth and the puerperium: Secondary | ICD-10-CM

## 2011-05-14 LAB — URINALYSIS, ROUTINE W REFLEX MICROSCOPIC
Glucose, UA: NEGATIVE mg/dL
Protein, ur: NEGATIVE mg/dL
Specific Gravity, Urine: 1.02 (ref 1.005–1.030)
pH: 7 (ref 5.0–8.0)

## 2011-05-19 ENCOUNTER — Encounter (INDEPENDENT_AMBULATORY_CARE_PROVIDER_SITE_OTHER): Payer: Medicaid Other | Admitting: Obstetrics & Gynecology

## 2011-05-19 DIAGNOSIS — Z3480 Encounter for supervision of other normal pregnancy, unspecified trimester: Secondary | ICD-10-CM

## 2011-06-03 ENCOUNTER — Encounter (INDEPENDENT_AMBULATORY_CARE_PROVIDER_SITE_OTHER): Payer: Medicaid Other | Admitting: Family Medicine

## 2011-06-03 DIAGNOSIS — Z3480 Encounter for supervision of other normal pregnancy, unspecified trimester: Secondary | ICD-10-CM

## 2011-06-03 LAB — STREP B DNA PROBE: GBS: NEGATIVE

## 2011-06-10 ENCOUNTER — Encounter (INDEPENDENT_AMBULATORY_CARE_PROVIDER_SITE_OTHER): Payer: Medicaid Other | Admitting: Obstetrics & Gynecology

## 2011-06-10 DIAGNOSIS — Z3480 Encounter for supervision of other normal pregnancy, unspecified trimester: Secondary | ICD-10-CM

## 2011-06-17 ENCOUNTER — Encounter: Payer: Self-pay | Admitting: Obstetrics & Gynecology

## 2011-06-17 ENCOUNTER — Ambulatory Visit (INDEPENDENT_AMBULATORY_CARE_PROVIDER_SITE_OTHER): Payer: Medicaid Other | Admitting: Obstetrics & Gynecology

## 2011-06-17 DIAGNOSIS — Z3480 Encounter for supervision of other normal pregnancy, unspecified trimester: Secondary | ICD-10-CM

## 2011-06-17 NOTE — Patient Instructions (Signed)
Labor precautions

## 2011-06-24 ENCOUNTER — Encounter (INDEPENDENT_AMBULATORY_CARE_PROVIDER_SITE_OTHER): Payer: Medicaid Other | Admitting: Family Medicine

## 2011-06-24 DIAGNOSIS — Z3480 Encounter for supervision of other normal pregnancy, unspecified trimester: Secondary | ICD-10-CM

## 2011-06-26 DIAGNOSIS — R12 Heartburn: Secondary | ICD-10-CM | POA: Insufficient documentation

## 2011-06-26 DIAGNOSIS — O26879 Cervical shortening, unspecified trimester: Secondary | ICD-10-CM | POA: Insufficient documentation

## 2011-06-26 DIAGNOSIS — D649 Anemia, unspecified: Secondary | ICD-10-CM | POA: Insufficient documentation

## 2011-06-30 ENCOUNTER — Encounter: Payer: Medicaid Other | Admitting: Obstetrics and Gynecology

## 2011-07-01 ENCOUNTER — Encounter: Payer: Medicaid Other | Admitting: Obstetrics & Gynecology

## 2011-07-02 ENCOUNTER — Inpatient Hospital Stay (HOSPITAL_COMMUNITY)
Admission: AD | Admit: 2011-07-02 | Discharge: 2011-07-02 | Disposition: A | Discharge status: Home / Self Care | Payer: Medicaid Other | Source: Ambulatory Visit | Attending: Obstetrics and Gynecology | Admitting: Obstetrics and Gynecology

## 2011-07-02 ENCOUNTER — Encounter (HOSPITAL_COMMUNITY): Payer: Self-pay | Admitting: *Deleted

## 2011-07-02 ENCOUNTER — Other Ambulatory Visit: Payer: Self-pay | Admitting: Advanced Practice Midwife

## 2011-07-02 DIAGNOSIS — N949 Unspecified condition associated with female genital organs and menstrual cycle: Secondary | ICD-10-CM

## 2011-07-02 DIAGNOSIS — IMO0002 Reserved for concepts with insufficient information to code with codable children: Secondary | ICD-10-CM | POA: Insufficient documentation

## 2011-07-02 DIAGNOSIS — R109 Unspecified abdominal pain: Secondary | ICD-10-CM | POA: Insufficient documentation

## 2011-07-02 DIAGNOSIS — M792 Neuralgia and neuritis, unspecified: Secondary | ICD-10-CM

## 2011-07-02 DIAGNOSIS — O9989 Other specified diseases and conditions complicating pregnancy, childbirth and the puerperium: Secondary | ICD-10-CM | POA: Insufficient documentation

## 2011-07-02 DIAGNOSIS — O99330 Smoking (tobacco) complicating pregnancy, unspecified trimester: Secondary | ICD-10-CM | POA: Insufficient documentation

## 2011-07-02 MED ORDER — ZOLPIDEM TARTRATE 10 MG PO TABS: 10.0000 mg | ORAL_TABLET | Freq: Every evening | ORAL | Status: DC | PRN

## 2011-07-02 MED ORDER — ZOLPIDEM TARTRATE ER 12.5 MG PO TBCR: 12.5000 mg | EXTENDED_RELEASE_TABLET | Freq: Every evening | ORAL | Status: DC | PRN

## 2011-07-02 NOTE — Progress Notes (Signed)
Pt states she has had decreased fetal movement today. Has been having L side pain that causes the L leg feel funny.

## 2011-07-02 NOTE — Progress Notes (Signed)
Pt states "Low L abd pain today, decrease FM today"

## 2011-07-02 NOTE — ED Provider Notes (Signed)
Chief Complaint:  Abdominal Pain   Barbara Moses is  25 y.o. G2P1001. [redacted]w[redacted]d.  She presents complaining of Abdominal Pain that describes as left-sided and sharp. Pain is aggrevated by her movement and fetal movement. Reports sharp pain runs down leg to knee at times. Onset is described as insidious and has been present for  1 days.   Obstetrical/Gynecological History: OB History    Grav Para Term Preterm Abortions TAB SAB Ect Mult Living   2 1 1  0 0 0 0 0 0 1      Past Medical History: Past Medical History  Diagnosis Date  . Gallstones   . Cervix, short (affecting pregnancy) 03/28/2011  . Anemia     Past Surgical History: Past Surgical History  Procedure Date  . Cholecystectomy     Family History: Family History  Problem Relation Age of Onset  . Fibroids Mother   . Diabetes Father   . Crohn's disease Father     Social History: History  Substance Use Topics  . Smoking status: Current Everyday Smoker -- 0.2 packs/day  . Smokeless tobacco: Never Used  . Alcohol Use: No    Allergies: No Known Allergies  No prescriptions prior to admission    Review of Systems - Negative except what's been reviewed in the HPI. Denies nausea, vomiting, diarrhea, HA, visual disturbance. Reports good FM and no s/s labor. Denies blding, LOF or vag d/c History obtained from the patient  Physical Exam   Blood pressure 118/71, pulse 97, temperature 98.9 F (37.2 C), temperature source Oral, resp. rate 18, height 5\' 4"  (1.626 m), weight 79.742 kg (175 lb 12.8 oz), last menstrual period 09/29/2010, SpO2 97.00%.  General: General appearance - oriented to person, place, and time and anxious Mental status - normal mood, behavior, speech, dress, motor activity, and thought processes, anxious Abdomen - Gravid, nontender Focused Gynecological Exam: cervical exam: FT/90/vtx/0 station. Unable to swept membranes FHR: 130, mod variability, + 15x15, no decels TOCO: no ctx  Assessment: [redacted]w[redacted]d  with Nerve and Ligament Pain  Plan: D/C Home Comfort Measures reviewed Rx Ambien prn FU as scheduled as [facility] as scheduled  Katurah Karapetian E. 07/02/2011,7:52 PM

## 2011-07-03 ENCOUNTER — Encounter (INDEPENDENT_AMBULATORY_CARE_PROVIDER_SITE_OTHER): Payer: Medicaid Other | Admitting: Obstetrics and Gynecology

## 2011-07-03 DIAGNOSIS — Z3480 Encounter for supervision of other normal pregnancy, unspecified trimester: Secondary | ICD-10-CM

## 2011-07-03 DIAGNOSIS — O99019 Anemia complicating pregnancy, unspecified trimester: Secondary | ICD-10-CM

## 2011-07-03 NOTE — Progress Notes (Signed)
  Subjective:    Patient ID: Barbara Moses, female    DOB: 06-22-1986, 25 y.o.   MRN: [id number]  HPI    Review of Systems     Objective:   Physical Exam        Assessment & Plan:  Attempted to print RX for Ambien but was unsuccessful. Wrote one for Ambien 10 mg Qhs prn insomnia, #10, no refills.

## 2011-07-04 ENCOUNTER — Inpatient Hospital Stay (HOSPITAL_COMMUNITY)
Admission: AD | Admit: 2011-07-04 | Discharge: 2011-07-07 | DRG: 775 | Disposition: A | Discharge status: Home / Self Care | Payer: Medicaid Other | Source: Ambulatory Visit | Attending: Obstetrics & Gynecology | Admitting: Obstetrics & Gynecology

## 2011-07-04 ENCOUNTER — Encounter (HOSPITAL_COMMUNITY): Payer: Self-pay | Admitting: *Deleted

## 2011-07-04 ENCOUNTER — Encounter (HOSPITAL_COMMUNITY): Payer: Self-pay | Admitting: Anesthesiology

## 2011-07-04 ENCOUNTER — Inpatient Hospital Stay (HOSPITAL_COMMUNITY): Payer: Medicaid Other | Admitting: Anesthesiology

## 2011-07-04 DIAGNOSIS — IMO0001 Reserved for inherently not codable concepts without codable children: Secondary | ICD-10-CM

## 2011-07-04 LAB — CBC
HCT: 37.3 % (ref 36.0–46.0)
MCH: 33.2 pg (ref 26.0–34.0)
MCV: 93.7 fL (ref 78.0–100.0)
Platelets: 177 10*3/uL (ref 150–400)
RBC: 3.98 MIL/uL (ref 3.87–5.11)

## 2011-07-04 MED ORDER — FLEET ENEMA 7-19 GM/118ML RE ENEM: 1.0000 | ENEMA | RECTAL | Status: DC | PRN

## 2011-07-04 MED ORDER — PHENYLEPHRINE 40 MCG/ML (10ML) SYRINGE FOR IV PUSH (FOR BLOOD PRESSURE SUPPORT)
80.0000 ug | PREFILLED_SYRINGE | INTRAVENOUS | Status: DC | PRN
  Filled 2011-07-04: qty 5

## 2011-07-04 MED ORDER — IBUPROFEN 600 MG PO TABS
600.0000 mg | ORAL_TABLET | Freq: Four times a day (QID) | ORAL | Status: DC | PRN
  Administered 2011-07-05 – 2011-07-07 (×10): 600 mg via ORAL
  Filled 2011-07-04 (×10): qty 1

## 2011-07-04 MED ORDER — NALBUPHINE SYRINGE 5 MG/0.5 ML
5.0000 mg | INJECTION | INTRAMUSCULAR | Status: DC | PRN
  Filled 2011-07-04: qty 0.5

## 2011-07-04 MED ORDER — EPHEDRINE 5 MG/ML INJ
10.0000 mg | INTRAVENOUS | Status: DC | PRN
  Filled 2011-07-04 (×2): qty 4

## 2011-07-04 MED ORDER — PHENYLEPHRINE 40 MCG/ML (10ML) SYRINGE FOR IV PUSH (FOR BLOOD PRESSURE SUPPORT)
80.0000 ug | PREFILLED_SYRINGE | INTRAVENOUS | Status: DC | PRN
  Filled 2011-07-04 (×2): qty 5

## 2011-07-04 MED ORDER — DIPHENHYDRAMINE HCL 50 MG/ML IJ SOLN
12.5000 mg | INTRAMUSCULAR | Status: DC | PRN
  Administered 2011-07-04 (×2): 12.5 mg via INTRAVENOUS
  Filled 2011-07-04: qty 1

## 2011-07-04 MED ORDER — LACTATED RINGERS IV SOLN
INTRAVENOUS | Status: DC
  Administered 2011-07-04 (×3): via INTRAVENOUS

## 2011-07-04 MED ORDER — OXYTOCIN 20 UNITS IN LACTATED RINGERS INFUSION - SIMPLE
125.0000 mL/h | Freq: Once | INTRAVENOUS | Status: AC
  Administered 2011-07-05: 999 mL/h via INTRAVENOUS
  Filled 2011-07-04: qty 1000

## 2011-07-04 MED ORDER — LIDOCAINE HCL 1.5 % IJ SOLN
INTRAMUSCULAR | Status: DC | PRN
  Administered 2011-07-04 (×2): 5 mL
  Administered 2011-07-04: 2 mL

## 2011-07-04 MED ORDER — CITRIC ACID-SODIUM CITRATE 334-500 MG/5ML PO SOLN
30.0000 mL | ORAL | Status: DC | PRN
  Administered 2011-07-04 – 2011-07-05 (×2): 30 mL via ORAL
  Filled 2011-07-04 (×2): qty 15

## 2011-07-04 MED ORDER — LACTATED RINGERS IV SOLN: 500.0000 mL | INTRAVENOUS | Status: DC | PRN

## 2011-07-04 MED ORDER — FENTANYL 2.5 MCG/ML BUPIVACAINE 1/10 % EPIDURAL INFUSION (WH - ANES)
14.0000 mL/h | INTRAMUSCULAR | Status: DC
  Administered 2011-07-04 (×4): 14 mL/h via EPIDURAL
  Filled 2011-07-04 (×3): qty 60

## 2011-07-04 MED ORDER — EPHEDRINE 5 MG/ML INJ
10.0000 mg | INTRAVENOUS | Status: DC | PRN
  Filled 2011-07-04: qty 4

## 2011-07-04 MED ORDER — OXYCODONE-ACETAMINOPHEN 5-325 MG PO TABS
2.0000 | ORAL_TABLET | ORAL | Status: DC | PRN
  Administered 2011-07-05 (×2): 2 via ORAL
  Filled 2011-07-04 (×2): qty 1
  Filled 2011-07-04 (×2): qty 2
  Filled 2011-07-04 (×2): qty 1
  Filled 2011-07-04: qty 2
  Filled 2011-07-04: qty 1
  Filled 2011-07-04: qty 2

## 2011-07-04 MED ORDER — ONDANSETRON HCL 4 MG/2ML IJ SOLN: 4.0000 mg | Freq: Four times a day (QID) | INTRAMUSCULAR | Status: DC | PRN

## 2011-07-04 MED ORDER — LIDOCAINE HCL (PF) 1 % IJ SOLN
30.0000 mL | INTRAMUSCULAR | Status: DC | PRN
  Filled 2011-07-04 (×2): qty 30

## 2011-07-04 MED ORDER — ZOLPIDEM TARTRATE 10 MG PO TABS: 10.0000 mg | ORAL_TABLET | Freq: Every evening | ORAL | Status: DC | PRN

## 2011-07-04 MED ORDER — ACETAMINOPHEN 325 MG PO TABS: 650.0000 mg | ORAL_TABLET | ORAL | Status: DC | PRN

## 2011-07-04 MED ORDER — LACTATED RINGERS IV SOLN
500.0000 mL | Freq: Once | INTRAVENOUS | Status: AC
  Administered 2011-07-04: 500 mL via INTRAVENOUS

## 2011-07-04 MED ORDER — OXYTOCIN 20 UNITS IN LACTATED RINGERS INFUSION - SIMPLE
1.0000 m[IU]/min | INTRAVENOUS | Status: DC
  Administered 2011-07-04: 1 m[IU]/min via INTRAVENOUS

## 2011-07-04 NOTE — Progress Notes (Signed)
Passed plug yesterday. Examined in office yesterday- "membranes swept", mucous now blood streaked.  Pressure.

## 2011-07-04 NOTE — Anesthesia Preprocedure Evaluation (Signed)
Anesthesia Evaluation  Name, MR# and DOB Patient awake  General Assessment Comment  Reviewed: Allergy & Precautions, H&P , Patient's Chart, lab work & pertinent test results and reviewed documented beta blocker date and time   History of Anesthesia Complications Negative for: history of anesthetic complications  Airway Mallampati: I TM Distance: >3 FB Neck ROM: full    Dental  (+)    Pulmonaryneg pulmonary ROS    clear to auscultation    Cardiovascular regular Normal   Neuro/PsychNegative Neurological ROS Negative Psych ROS  GI/Hepatic/Renal negative Liver ROS, and negative Renal ROS (+)  GERD Medicated and Poorly Controlled     Endo/Other  Negative Endocrine ROS (+)   Abdominal   Musculoskeletal  Hematology negative hematology ROS (+)   Peds  Reproductive/Obstetrics (+) Pregnancy   Anesthesia Other Findings             Anesthesia Physical Anesthesia Plan  ASA: II  Anesthesia Plan: Epidural   Post-op Pain Management:    Induction:   Airway Management Planned:   Additional Equipment:   Intra-op Plan:   Post-operative Plan:   Informed Consent: I have reviewed the patients History and Physical, chart, labs and discussed the procedure including the risks, benefits and alternatives for the proposed anesthesia with the patient or authorized representative who has indicated his/her understanding and acceptance.     Plan Discussed with:   Anesthesia Plan Comments:         Anesthesia Quick Evaluation

## 2011-07-04 NOTE — Progress Notes (Signed)
Pt to room 164 for management of labor.

## 2011-07-04 NOTE — Progress Notes (Signed)
Barbara Moses is a 25 y.o. G2P0101 at [redacted]w[redacted]d by LMP admitted for contractions.  Subjective: Pt reports comfortable after epidural.  No questions or concerns.    Objective: BP 113/77  Pulse 90  Temp(Src) 97.9 F (36.6 C) (Oral)  Resp 20  Ht 5\' 2"  (1.575 m)  Wt 79.833 kg (176 lb)  BMI 32.19 kg/m2  SpO2 100%  LMP 09/29/2010      FHT:  FHR: 110's-120's bpm, variability: moderate,  accelerations:  Present,  decelerations:  Absent UC:   regular, every 2-6 minutes SVE:   Dilation: 3 Effacement (%): 100 Station: -1 Exam by:: Dr. Natale Milch  Labs: Lab Results  Component Value Date   WBC 14.4* 07/04/2011   HGB 13.2 07/04/2011   HCT 37.3 07/04/2011   MCV 93.7 07/04/2011   PLT 177 07/04/2011    Assessment / Plan: Spontaneous labor, progressing normally  Labor: Progressing normally Preeclampsia:  N/A Fetal Wellbeing:  Category I Pain Control:  Epidural I/D:  n/a Anticipated MOD:  NSVD  [facility] 07/04/2011, 3:59 PM

## 2011-07-04 NOTE — Progress Notes (Signed)
Pt states she was up @ 0330, was having some uc's, they have become increasingly more intense throughout the morning.  Bloody show yesterday.

## 2011-07-04 NOTE — Progress Notes (Signed)
Barbara Moses is a 25 y.o. G2P0101 at [redacted]w[redacted]d by LMP admitted for contractions.  Subjective:  Pt reports continued comfort after epidural.  No questions or concerns.   Objective: BP 97/70  Pulse 79  Temp(Src) 98.1 F (36.7 C) (Oral)  Resp 16  Ht 5\' 2"  (1.575 m)  Wt 79.833 kg (176 lb)  BMI 32.19 kg/m2  SpO2 100%  LMP 09/29/2010     FHT:  FHR: 110's bpm, variability: moderate,  accelerations:  Present,  decelerations:  Absent UC:   irregular, every 4-7 minutes SVE:   Dilation: 5 Effacement (%): 100 Station: 0 Exam by:: Lilli Few, RN  Labs: Lab Results  Component Value Date   WBC 14.4* 07/04/2011   HGB 13.2 07/04/2011   HCT 37.3 07/04/2011   MCV 93.7 07/04/2011   PLT 177 07/04/2011    Assessment / Plan: Protracted latent phase  Labor: Protracted Latent Phase Preeclampsia:  n/a Fetal Wellbeing:  Category I Pain Control:  Epidural I/D:  n/a Anticipated MOD:  NSVD  Begin low dose pitocin per orders  [facility] 07/04/2011, 9:40 PM

## 2011-07-04 NOTE — H&P (Signed)
Barbara Moses is a 25 y.o. female G2P0101 at 39.[redacted] wks EGA presents for labor. Pt gets her care at [facility], and was seen yesterday, had her membranes stripped. She has history of one vaginal delivery at about 36 weeks, and she used prometrium for a brief period of time during this pregnancy for concern of short cervix. She started contracting last night, but they have gotten much stronger and more regular. Checked into MAU earlier this AM, and was only about 1.5 cm dilated, but went walking and is 3.5-4 cm and actively contracting, getting stronger by report. Maternal Medical History:  Reason for admission: Reason for admission: contractions.  Contractions: Onset was 3-5 hours ago.   Frequency: regular.   Perceived severity is moderate.    Fetal activity: Perceived fetal activity is normal.   Last perceived fetal movement was within the past hour.    Prenatal complications: No bleeding, HIV, placental abnormality, pre-eclampsia, preterm labor or substance abuse.   Prenatal Complications - Diabetes: none.    OB History    Grav Para Term Preterm Abortions TAB SAB Ect Mult Living   2 1  1  0 0 0 0 0 1     Past Medical History  Diagnosis Date  . Gallstones   . Cervix, short (affecting pregnancy) 03/28/2011  . Anemia    Past Surgical History  Procedure Date  . Cholecystectomy   . Wisdom tooth extraction    Family History: family history includes Crohn's disease in her father; Diabetes in her father; and Fibroids in her mother. Social History:  reports that she has been smoking Cigarettes.  She has a 1.75 pack-year smoking history. She has never used smokeless tobacco. She reports that she does not drink alcohol or use illicit drugs.  Review of Systems  Constitutional: Negative.   HENT: Negative.   Eyes: Negative.   Respiratory: Negative.   Cardiovascular: Negative.   Gastrointestinal: Negative.   Genitourinary: Negative.   Musculoskeletal: Negative.   Skin: Negative.     Neurological: Negative.   Endo/Heme/Allergies: Negative.   Psychiatric/Behavioral: Negative.     Dilation: 3 Effacement (%): 100 Station: -1 Exam by:: Dr. Natale Milch Blood pressure 120/73, pulse 113, temperature 98.4 F (36.9 C), temperature source Oral, resp. rate 20, height 5' 2.5" (1.588 m), weight 176 lb 9.6 oz (80.105 kg), last menstrual period 09/29/2010. Maternal Exam:  Uterine Assessment: Contraction strength is moderate.  Contraction duration is 60 seconds. Contraction frequency is regular.   Abdomen: Estimated fetal weight is 8 lbs.   Fetal presentation: vertex  Introitus: Normal vulva. Normal vagina.  Ferning test: not done.   Pelvis: adequate for delivery.   Cervix: Cervix evaluated by digital exam.     Physical Exam  Constitutional: She is oriented to person, place, and time. She appears well-developed and well-nourished. She appears distressed.  HENT:  Head: Normocephalic.  Eyes: Pupils are equal, round, and reactive to light.  Neck: Normal range of motion.  Cardiovascular: Normal rate, regular rhythm and intact distal pulses.  Exam reveals no gallop and no friction rub.   Murmur heard. Respiratory: Effort normal and breath sounds normal. No respiratory distress. She has no wheezes. She has no rales.  GI: Soft. Bowel sounds are normal. There is no tenderness. There is no rebound and no guarding.  Musculoskeletal: Normal range of motion.  Neurological: She is alert and oriented to person, place, and time. She has normal reflexes. She displays normal reflexes. No cranial nerve deficit.  Skin: Skin is warm  and dry. No rash noted. She is not diaphoretic.  Psychiatric: She has a normal mood and affect.    Prenatal labs: ABO, Rh:  A+ Antibody: NEG (02/09 2112) Rubella:  Immune RPR: NON REAC (02/09 2112)  HBsAg: NEGATIVE (02/09 2112)  HIV: NON REACTIVE (02/09 2112)  GBS: Negative (06/26 0000)  1 Hr GGT: 117   Assessment/Plan: Active labor in G2P0101 at 39.[redacted]  wks EGA. Will admit for labor and delivery. Pt wishes to breast feed and plans on OCP for birth control after delivery. Desires epidural. GBS neg.   Janki Dike N 07/04/2011, 1:10 PM

## 2011-07-04 NOTE — Progress Notes (Signed)
Barbara Moses is a 25 y.o. G2P0101 at [redacted]w[redacted]d by LMP admitted for contractions.  Subjective: Pt reports comfortable after epidural.  No questions or concerns.   Objective: BP 102/73  Pulse 91  Temp(Src) 97.9 F (36.6 C) (Oral)  Resp 20  Ht 5\' 2"  (1.575 m)  Wt 79.833 kg (176 lb)  BMI 32.19 kg/m2  SpO2 100%  LMP 09/29/2010      FHT:  FHR: 110's bpm, variability: moderate,  accelerations:  Present,  decelerations:  Absent UC:   regular, every 3-5 minutes SVE:   Dilation: 4 Effacement (%): 100 Station: 0 Exam by:: Barbara Moses CNM AROM clear fluid  Labs: Lab Results  Component Value Date   WBC 14.4* 07/04/2011   HGB 13.2 07/04/2011   HCT 37.3 07/04/2011   MCV 93.7 07/04/2011   PLT 177 07/04/2011    Assessment / Plan: Labor, no change  Labor: Early Labor Preeclampsia:  N/A Fetal Wellbeing:  Category I Pain Control:  Epidural I/D:  n/a Anticipated MOD:  NSVD  [facility] 07/04/2011, 6:54 PM

## 2011-07-04 NOTE — Anesthesia Procedure Notes (Addendum)
Epidural Patient location during procedure: OB Start time: 07/04/2011 2:50 PM Reason for block: procedure for pain  Staffing Anesthesiologist: Onofre Gains L. Performed by: anesthesiologist   Preanesthetic Checklist Completed: patient identified, site marked, surgical consent, pre-op evaluation, timeout performed, IV checked, risks and benefits discussed and monitors and equipment checked  Epidural Patient position: sitting Prep: site prepped and draped and DuraPrep Patient monitoring: continuous pulse ox and blood pressure Approach: midline Injection technique: LOR air  Needle:  Needle type: Tuohy  Needle gauge: 17 G Needle length: 9 cm Catheter type: closed end flexible Catheter size: 19 Gauge Catheter at skin depth: 10 cm Test dose: negative  Assessment Events: blood not aspirated, injection not painful, no injection resistance, negative IV test and no paresthesia  Additional Notes Discussed risk of headache, infection, bleeding, nerve injury and failed or incomplete block.  Patient voices understanding and wishes to proceed.

## 2011-07-05 MED ORDER — ONDANSETRON HCL 4 MG/2ML IJ SOLN: 4.0000 mg | INTRAMUSCULAR | Status: DC | PRN

## 2011-07-05 MED ORDER — WITCH HAZEL-GLYCERIN EX PADS: MEDICATED_PAD | CUTANEOUS | Status: DC | PRN

## 2011-07-05 MED ORDER — OXYCODONE-ACETAMINOPHEN 5-325 MG PO TABS
1.0000 | ORAL_TABLET | ORAL | Status: DC | PRN
  Administered 2011-07-05: 2 via ORAL
  Administered 2011-07-05: 1 via ORAL
  Administered 2011-07-06: 2 via ORAL
  Administered 2011-07-06 – 2011-07-07 (×4): 1 via ORAL

## 2011-07-05 MED ORDER — BENZOCAINE-MENTHOL 20-0.5 % EX AERO
1.0000 "application " | INHALATION_SPRAY | CUTANEOUS | Status: DC | PRN
  Administered 2011-07-07: 1 via TOPICAL

## 2011-07-05 MED ORDER — PRENATAL PLUS 27-1 MG PO TABS
1.0000 | ORAL_TABLET | Freq: Every day | ORAL | Status: DC
  Administered 2011-07-05 – 2011-07-07 (×3): 1 via ORAL
  Filled 2011-07-05 (×4): qty 1

## 2011-07-05 MED ORDER — ONDANSETRON HCL 4 MG PO TABS: 4.0000 mg | ORAL_TABLET | ORAL | Status: DC | PRN

## 2011-07-05 MED ORDER — DIPHENHYDRAMINE HCL 25 MG PO CAPS: 25.0000 mg | ORAL_CAPSULE | Freq: Four times a day (QID) | ORAL | Status: DC | PRN

## 2011-07-05 MED ORDER — ZOLPIDEM TARTRATE 5 MG PO TABS: 5.0000 mg | ORAL_TABLET | Freq: Every evening | ORAL | Status: DC | PRN

## 2011-07-05 MED ORDER — LANOLIN HYDROUS EX OINT: TOPICAL_OINTMENT | CUTANEOUS | Status: DC | PRN

## 2011-07-05 MED ORDER — SENNOSIDES-DOCUSATE SODIUM 8.6-50 MG PO TABS
1.0000 | ORAL_TABLET | Freq: Every day | ORAL | Status: DC
  Administered 2011-07-05 – 2011-07-06 (×2): 2 via ORAL

## 2011-07-05 MED ORDER — TETANUS-DIPHTH-ACELL PERTUSSIS 5-2.5-18.5 LF-MCG/0.5 IM SUSP: 0.5000 mL | Freq: Once | INTRAMUSCULAR | Status: DC

## 2011-07-05 MED ORDER — SIMETHICONE 80 MG PO CHEW: 80.0000 mg | CHEWABLE_TABLET | ORAL | Status: DC | PRN

## 2011-07-05 NOTE — Progress Notes (Signed)
First time BF mom. Baby less than 24 hours and had good feeding for 30 minutes 3 1/2 hours ago. Awakened baby and put to breast- off to sleep. Did not nurse- placed skin to skin. No questions at present. To page for assist prn. Handout given.

## 2011-07-05 NOTE — Anesthesia Postprocedure Evaluation (Signed)
  Anesthesia Post-op Note  Patient: Barbara Moses  Procedure(s) Performed: * No procedures listed *  Patient stable following vaginal delivery.

## 2011-07-06 NOTE — Progress Notes (Signed)
Post Partum Day 1 Subjective: no complaints, tolerating PO and + flatus, normal lochia, absent BM, present flatus, plans to breastfeed, oral progesterone-only contraceptive  Objective: Blood pressure 93/63, pulse 76, temperature 97.6 F (36.4 C), temperature source Oral, resp. rate 18, height 5\' 2"  (1.575 m), weight 79.833 kg (176 lb), last menstrual period 09/29/2010, SpO2 100.00%.  Physical Exam:  General: alert and cooperative Lochia: appropriate Chest: CTAB Heart: RRR no m/r/g Abdomen: +BS, soft, nontender,  Uterine Fundus: firm At umbilicua DVT Evaluation: No evidence of DVT seen on physical exam. Extremities: no c/c/e   Basename 07/04/11 1405  HGB 13.2  HCT 37.3    Assessment/Plan: Plan for discharge tomorrow and Lactation consult Continue routine pp care.    LOS: 2 days   Mikah Poss 07/06/2011, 6:29 AM

## 2011-07-07 ENCOUNTER — Encounter (HOSPITAL_COMMUNITY): Payer: Self-pay | Admitting: *Deleted

## 2011-07-07 MED ORDER — IBUPROFEN 600 MG PO TABS: 600.0000 mg | ORAL_TABLET | Freq: Four times a day (QID) | ORAL | Status: AC | PRN

## 2011-07-07 MED ORDER — BENZOCAINE-MENTHOL 20-0.5 % EX AERO
INHALATION_SPRAY | CUTANEOUS | Status: AC
  Administered 2011-07-07: 1 via TOPICAL
  Filled 2011-07-07: qty 56

## 2011-07-07 MED ORDER — BENZOCAINE-MENTHOL 20-0.5 % EX AERO: 1.0000 "application " | INHALATION_SPRAY | CUTANEOUS | Status: DC | PRN

## 2011-07-07 MED ORDER — OXYCODONE-ACETAMINOPHEN 5-325 MG PO TABS: 1.0000 | ORAL_TABLET | ORAL | Status: AC | PRN

## 2011-07-07 MED ORDER — LANOLIN HYDROUS EX OINT: 1.0000 "application " | TOPICAL_OINTMENT | CUTANEOUS | Status: DC | PRN

## 2011-07-07 NOTE — Progress Notes (Signed)
UR chart review completed.  

## 2011-07-07 NOTE — Progress Notes (Signed)
Per mom sore bilateraaly . Both upper portion of nipples bruised . At consult worked on depth . Mom more comfortable . Instructed on shells ,hand pump, comfort gels and tx for sore nipples .

## 2011-07-07 NOTE — Anesthesia Postprocedure Evaluation (Signed)
  Anesthesia Post-op Note  Patient: Barbara Moses  Procedure(s) Performed: * No procedures listed *  Patient Location: 123  Anesthesia Type: Epidural  Level of Consciousness: awake, alert  and oriented  Airway and Oxygen Therapy: Patient Spontanous Breathing    Post-op Assessment: Post-op Vital signs reviewed and Patient's Cardiovascular Status Stable  Post-op Vital Signs: Reviewed and stable  Complications: No apparent anesthesia complications

## 2011-07-07 NOTE — Addendum Note (Signed)
Addendum  created 07/07/11 1108 by Onofrio Klemp   Modules edited:Notes Section    

## 2011-07-07 NOTE — Discharge Summary (Signed)
Obstetric Discharge Summary Reason for Admission: onset of labor Prenatal Procedures: NST and ultrasound Intrapartum Procedures: spontaneous vaginal delivery Postpartum Procedures: none Complications-Operative and Postpartum: 2 degree perineal laceration  Hemoglobin  Date Value Range Status  07/04/2011 13.2  12.0-15.0 (g/dL) Final     HCT  Date Value Range Status  07/04/2011 37.3  36.0-46.0 (%) Final    Discharge Diagnoses: Term Pregnancy-delivered  Discharge Information: Date: 07/07/2011 Activity: pelvic rest Diet: routine Medications: Ibuprophen and Percocet Condition: stable Instructions: refer to practice specific booklet Discharge to: home   Newborn Data: Live born  Information for the patient's newborn:  Zian, Mohamed [[id number]]  female ; APGAR 9/9, ; weight 9#11 ;  Home with mother.  Deliah Strehlow E. 07/07/2011, 6:47 AM

## 2011-07-07 NOTE — Progress Notes (Signed)
Post Partum Day2 Subjective: no complaints  Objective: Blood pressure 109/80, pulse 76, temperature 97.5 F (36.4 C), temperature source Oral, resp. rate 18, height 5\' 2"  (1.575 m), weight 79.833 kg (176 lb), last menstrual period 09/29/2010, SpO2 100.00%.  Physical Exam:  General: alert, cooperative, appears stated age and no distress Lochia: appropriate Uterine Fundus: firm Incision: N/A DVT Evaluation: No evidence of DVT seen on physical exam.   Basename 07/04/11 1405  HGB 13.2  HCT 37.3    Assessment/Plan: Discharge home, Breastfeeding and Contraception POPs Follow up at [facility] in 4-6 weeks Rx Ibuprofen and Percocet   LOS: 3 days   Barbara Moses E. 07/07/2011, 6:46 AM

## 2011-07-07 NOTE — Addendum Note (Signed)
Addendum  created 07/07/11 1108 by Cephus Shelling   Modules edited:Notes Section

## 2011-07-08 ENCOUNTER — Encounter: Payer: Medicaid Other | Admitting: Family Medicine

## 2011-07-18 ENCOUNTER — Encounter (HOSPITAL_COMMUNITY): Payer: Self-pay | Admitting: *Deleted

## 2011-07-26 ENCOUNTER — Emergency Department (HOSPITAL_BASED_OUTPATIENT_CLINIC_OR_DEPARTMENT_OTHER)
Admission: EM | Admit: 2011-07-26 | Discharge: 2011-07-26 | Disposition: A | Discharge status: Home / Self Care | Payer: Medicaid Other | Attending: Emergency Medicine | Admitting: Emergency Medicine

## 2011-07-26 ENCOUNTER — Encounter (HOSPITAL_BASED_OUTPATIENT_CLINIC_OR_DEPARTMENT_OTHER): Payer: Self-pay | Admitting: *Deleted

## 2011-07-26 DIAGNOSIS — R51 Headache: Secondary | ICD-10-CM | POA: Insufficient documentation

## 2011-07-26 DIAGNOSIS — F17200 Nicotine dependence, unspecified, uncomplicated: Secondary | ICD-10-CM | POA: Insufficient documentation

## 2011-07-26 MED ORDER — IBUPROFEN 800 MG PO TABS: 800.0000 mg | ORAL_TABLET | Freq: Three times a day (TID) | ORAL | Status: DC

## 2011-07-26 MED ORDER — OXYCODONE-ACETAMINOPHEN 5-325 MG PO TABS: 1.0000 | ORAL_TABLET | ORAL | Status: AC | PRN

## 2011-07-26 MED ORDER — AMOXICILLIN 500 MG PO CAPS: 500.0000 mg | ORAL_CAPSULE | Freq: Three times a day (TID) | ORAL | Status: DC

## 2011-07-26 MED ORDER — KETOROLAC TROMETHAMINE 60 MG/2ML IM SOLN
60.0000 mg | Freq: Once | INTRAMUSCULAR | Status: AC
  Administered 2011-07-26: 60 mg via INTRAMUSCULAR
  Filled 2011-07-26: qty 2

## 2011-07-26 NOTE — ED Notes (Signed)
Pt states she has had a frontal H/A x 3 days. Hx of migraines and H/A's during pregnancy. Also states she has 2 cavities (upper) and may have a sinus infection.

## 2011-07-26 NOTE — ED Notes (Signed)
MD at bedside. 

## 2011-07-26 NOTE — ED Provider Notes (Signed)
History   Scribed for Barbara Roller, MD, the patient was seen in room MH09/MH09 . This chart was scribed by Desma Paganini. This patient's care was started at 5:00 PM .    CSN: [id number] Arrival date & time: 07/26/2011  4:45 PM  Chief Complaint  Patient presents with  . Headache   HPI Barbara Moses is a 25 y.o. female who presents to the Emergency Department complaining of frontal headache that has been present for 3 days. She states that the head pain started on her right side, then moved to the left but is now concentrated in the frontal region. She also noted a little pressure under the right eye. She rates her pain as a 9/10 and states that the pain is constant. She has been sneezing and has a mild cough but denies drainage, stiff neck, numbness, weakness, and vision changes. She has been taking Tylenol for the past 3 days to treat the headache without relief. The pt has a hx of migraines and headaches during pregnancy.   HPI ELEMENTS:  Location: frontal region of head Onset: 3 days Duration: 3 days Timing: constant  Severity: 9/10  Modifying factors: has been taking Tylenol with no relief  Context:  as above  Associated symptoms: hx of migraines and had ha during pregnancy; states that both children could be developing colds   PAST MEDICAL HISTORY:  Past Medical History  Diagnosis Date  . Gallstones   . Cervix, short (affecting pregnancy) 03/28/2011  . Anemia     PAST SURGICAL HISTORY:  Past Surgical History  Procedure Date  . Cholecystectomy   . Wisdom tooth extraction     MEDICATIONS:  Previous Medications   ACETAMINOPHEN (TYLENOL) 500 MG TABLET    Take 1,000 mg by mouth every 6 (six) hours as needed. for pain   BENZOCAINE-MENTHOL (DERMOPLAST) 20-0.5 % AERO    Apply 1 application topically as needed (perineal discomfort).   IBUPROFEN (ADVIL,MOTRIN) 200 MG TABLET    Take 400 mg by mouth every 6 (six) hours as needed. Pain     LANOLIN OINT    Apply 1 application  topically as needed (for breast care).   PRENAT W/O A-FE-DSS-METHFOL-FA (PRENATAL MULTIVITAMIN) 90-600-400 MG-MCG-MCG TABLET    Take 1 tablet by mouth daily.      ALLERGIES:  Allergies as of 07/26/2011  . (No Known Allergies)     FAMILY HISTORY:  No Pertinent Family History Family History  Problem Relation Age of Onset  . Fibroids Mother   . Diabetes Father   . Crohn's disease Father   . Cancer Maternal Grandmother      SOCIAL HISTORY: History  Substance Use Topics  . Smoking status: Current Everyday Smoker -- 0.2 packs/day for 7 years    Types: Cigarettes  . Smokeless tobacco: Never Used  . Alcohol Use: No    OB History    Grav Para Term Preterm Abortions TAB SAB Ect Mult Living   2 2 1 1  0 0 0 0 0 2      Review of Systems 10 Systems reviewed and are negative for acute change except as noted in the HPI.  Physical Exam  BP 105/67  Pulse 82  Temp(Src) 98 F (36.7 C) (Oral)  Resp 20  Ht 5' 2.5" (1.588 m)  Wt 150 lb (68.04 kg)  BMI 27.00 kg/m2  SpO2 98%  LMP 09/24/2010  Breastfeeding? Yes  Physical Exam  Nursing note and vitals reviewed. Constitutional: She is oriented to person,  place, and time. She appears well-developed and well-nourished.  HENT:  Head: Normocephalic and atraumatic.  Right Ear: External ear normal.  Left Ear: External ear normal.  Nose: Nose normal.  Mouth/Throat: Oropharynx is clear and moist.  Eyes: EOM are normal. Pupils are equal, round, and reactive to light.       3mm reactive pupils  Neck: Normal range of motion. Neck supple.  Cardiovascular: Normal rate, normal heart sounds and intact distal pulses.   Pulmonary/Chest: Effort normal and breath sounds normal.  Abdominal: Bowel sounds are normal. She exhibits no distension. There is no tenderness.  Musculoskeletal: Normal range of motion. She exhibits no edema and no tenderness.  Lymphadenopathy:    She has no cervical adenopathy.  Neurological: She is alert and oriented to  person, place, and time. No cranial nerve deficit.       Sensation intact  Skin: Skin is warm and dry. No rash noted. She is not diaphoretic.  Psychiatric: She has a normal mood and affect. Her behavior is normal. Judgment normal.    ED Course  Procedures  MDM Normal vital signs, no hypertension, no fever. She has a very supple neck with no lymphadenopathy, oropharynx is clear with no erythema, asymmetry, hypertrophy or exudate. Tympanic membranes are clear bilaterally, nares are clear without swelling of the turbinates or discharge. Mild tenderness over the frontal sinuses. Overall the patient appears well with a  neurologic exam including motor, no limb ataxia, cranial nerves III through XII normal. We'll give her intramuscular Toradol and sent her home with prescription for amoxicillin as may have an early sinusitis. I have recommended that she followup within the next couple of days if she is having persistent symptoms. At this time I doubt that this is related to secondary headache    I personally performed the services described in this documentation, which was scribed in my presence. The recorded information has been reviewed and considered. Barbara Roller, MD    Barbara Roller, MD 07/26/11 [phone]

## 2011-07-29 ENCOUNTER — Encounter: Payer: Self-pay | Admitting: Nurse Practitioner

## 2011-07-29 ENCOUNTER — Ambulatory Visit: Payer: Medicaid Other | Admitting: Nurse Practitioner

## 2011-07-29 MED ORDER — PROMETHAZINE HCL 25 MG PO TABS: 25.0000 mg | ORAL_TABLET | Freq: Four times a day (QID) | ORAL | Status: AC | PRN

## 2011-07-29 MED ORDER — SUMATRIPTAN SUCCINATE 100 MG PO TABS: 100.0000 mg | ORAL_TABLET | Freq: Once | ORAL | Status: DC | PRN

## 2011-07-29 MED ORDER — IBUPROFEN 800 MG PO TABS: 800.0000 mg | ORAL_TABLET | Freq: Three times a day (TID) | ORAL | Status: AC | PRN

## 2011-07-29 NOTE — Patient Instructions (Signed)
Pt advised if she discontinues breast feeding she will be candidate for headache prevention

## 2011-07-29 NOTE — Progress Notes (Signed)
  Subjective:    Patient ID: Barbara Moses, female    DOB: [birth year], 25 y.o.   MRN: [id number]  HPI C/O headache off and on for 5 years. Mother has migraine. Went to MD who told her she may have " sinus infection" on was given amoxicillin. This has not been helpful. She had no sx of infection. She has right sided pain, photophobia, nausea, phonophobia. Headache was ongoing for 3 days. No aura. She admits to 10 severe per month, 3 moderates per month and 5 milds per month. She is currently using OTC pain meds and was given percocet when she delivered baby boy 3 weeks ago. She is a single mom with newborn and 64 year old. She has not been working since she fell at [redacted] weeks pregnant so money is an issue. She will be loosing medicaid at end of month. All these things have caused her to be more anxious than usual. She is currently breast and bottle feeding. She has not resumed sexual intercourse. Would like to have reliable birth control. Plan to return to work as soon as possible.    Review of Systems  HENT: Positive for sneezing. Negative for neck pain and neck stiffness.   Eyes: Positive for photophobia.  Respiratory: Negative.   Cardiovascular: Negative.   Genitourinary: Positive for vaginal bleeding.  Musculoskeletal: Negative for arthralgias.  Neurological: Positive for dizziness and headaches. Negative for syncope and weakness.  Psychiatric/Behavioral:       Anxiety  All other systems reviewed and are negative.       Objective:   Physical Exam  Constitutional: She is oriented to person, place, and time. She appears well-developed and well-nourished. No distress.  HENT:  Head: Normocephalic and atraumatic.  Eyes: Pupils are equal, round, and reactive to light.  Neck: Normal range of motion.  Cardiovascular: Normal rate, regular rhythm and normal heart sounds.   Pulmonary/Chest: Breath sounds normal.  Abdominal: Soft.  Musculoskeletal: Normal range of motion.  Neurological: She is  alert and oriented to person, place, and time. No cranial nerve deficit. Coordination normal.  Skin: Skin is warm and dry. She is not diaphoretic.  Psychiatric: She has a normal mood and affect. Her behavior is normal. Judgment and thought content normal.          Assessment & Plan:  A: Migraine without aura Postpartum Contraception  P: Lengthy discussion concerning headache patho and triggers. Discussed birth control and she would like Nexplanon ASAP. Aware she is not to have any unprotected intercourse until then. DC amoxicillin. RX for imitrex, discussed pump and dump after use. She will bottle feed next feeding time. RX for phenergan and Motrin to use as needed. Still has some percocet from delivery and can use that for rescue. Need 6 week OB visit scheduled.  TDAP today.

## 2011-08-05 ENCOUNTER — Encounter: Payer: Medicaid Other | Admitting: Family Medicine

## 2011-08-06 ENCOUNTER — Encounter: Payer: Self-pay | Admitting: Obstetrics & Gynecology

## 2011-08-06 ENCOUNTER — Ambulatory Visit (INDEPENDENT_AMBULATORY_CARE_PROVIDER_SITE_OTHER): Payer: Medicaid Other | Admitting: Obstetrics & Gynecology

## 2011-08-06 VITALS — BP 92/73 | HR 102 | Ht 62.0 in | Wt 154.0 lb

## 2011-08-06 DIAGNOSIS — Z3009 Encounter for other general counseling and advice on contraception: Secondary | ICD-10-CM

## 2011-08-06 MED ORDER — NORGESTIM-ETH ESTRAD TRIPHASIC 0.18/0.215/0.25 MG-35 MCG PO TABS: 1.0000 | ORAL_TABLET | Freq: Every day | ORAL | Status: DC

## 2011-08-06 NOTE — Progress Notes (Signed)
  Subjective:    Patient ID: Barbara Moses, female    DOB: 21-Mar-1986, 25 y.o.   MRN: [id number]  HPI Post partum 07/05/11 SVD baby doing well, breat and bottle, wants to start OCP    Review of Systems No vaginal bleeding, has abstained since delivery     Objective:   Physical ExamNAD, pleasant  Filed Vitals:   08/06/11 0926  BP: 92/73  Pulse: 102           Assessment & Plan:   Subjective:    Barbara Moses is a 25 y.o. female who presents for contraception counseling. The patient has no complaints today. The patient is not currently sexually active. Pertinent past medical history: none.  Menstrual History: OB History    Grav Para Term Preterm Abortions TAB SAB Ect Mult Living   2 2 1 1  0 0 0 0 0 2      Menarche age:  Patient's last menstrual period was 09/24/2010.    The following portions of the patient's history were reviewed and updated as appropriate: allergies, current medications, past family history, past medical history, past social history, past surgical history and problem list.  Review of Systems Pertinent items are noted in HPI.   Objective:    No exam performed today, Exam scheduled 08/19/11.   Assessment:    25 y.o., starting OCP (estrogen/progesterone), no contraindications.   Plan:    Ortho Tricyclen Rx . RTC 08/19/11 as scheduled

## 2011-08-06 NOTE — Progress Notes (Signed)
Patient here to discuss birth control options.

## 2011-08-06 NOTE — Patient Instructions (Signed)
Oral Contraceptives (Birth Control Pills) Oral contraceptives (OCs) are medicines taken to prevent pregnancy. They are the most widely used method of birth control. OCs work by preventing the ovaries from releasing eggs. The OC hormones also cause the mucus on the cervix to thicken, preventing the sperm from entering the uterus. They also cause the lining of the uterus to become thin, not allowing a fertilized egg to attach to the inside of the uterus. OCs have a failure rate of less than 1%, when taken exactly as prescribed. THERE ARE 2 TYPES OF OC  OC that contains a mix of estrogen and progesterone hormones is the most common OC used. It is taken for 21 days, followed by 7 days of not taking the OC hormones. It can be packaged as 28 pills, with the last 7 pills being inactive. You take a pill every day. This way you do not need to remember when to restart taking the active pills. Most women will begin their menstrual period 2 to 3 days after taking the hormone pill. The menstrual period is usually lighter and shorter. This combination OC should not be taken if you are breast-feeding.   The progesterone only (minipill) OC does not contain estrogen. It is taken every day, continuously. You may have only spotting for a period, or no period at all. The progesterone only OC can be taken if you are breast-feeding your baby.  OCs come in:  Packs of 21 pills, with no pills to take for 7 days after the last pill.   Packs of 28 pills, with a pill to take every day. The last 7 pills are without hormones.   Packs of 91 pills (continuous or extended use), with a pill to take every day. The first 84 pills contain the hormones, and the last 7 pills do not. That is when you will have your menstrual period. You will not have a menstrual period during the time you are taking the first 84 pills.  HOW TO TAKE OC Your caregiver may advise you on how to start taking the first cycle of OCs. Otherwise, you can:  Start  on day 1 or day 5 of your menstrual period, taking the first pack of the OC. You will not need any backup contraceptive protection with this start time.   Start on the first Sunday after your menstrual period, day 7 of your menstrual period, or the day you get your prescription. In these cases, you will need backup contraceptive protection for the first cycle.  No matter which day you start the OC, you will always start a new pack on that same day of the week. It is a good idea to have an extra pack of OCs and a backup contraceptive method available, in case you miss some pills or lose your OC pack. COMMON REASONS FOR FAILURE   Forgetting to take the pill at the same time every day.   Poor absorption of the pill from the stomach into the bloodstream. This can be caused by diarrhea, vomiting, and the use of some medicines that kill germs (antibiotics).   Stomach or intestinal disease.   Taking OCs with other medicines that may make them less effective (carbamazepine, phenytoin, phenobarbital, rifampin).   Using OCs that have passed their expiration dates.   Forgetting to restart the pills on day 7, when using the packs of 21 pills.  If you forget to take 1 pill, take it as soon as you remember, and take the next   pill at the regular time. If you miss 2 or more pills, use backup birth control until your next menstrual period starts. Also, you may have vaginal spotting or bleeding when you miss 2 or more OC pills. If you use the pack of 28 pills or 91 pills, and you miss 1 of the last 7 pills (pills with no hormones), it will not matter. Just throw away the rest of the non-hormone pills and start a new 28 or 91 pill pack. COMMON USES OF OC  Decreasing premenstrual problems (symptoms).  Treating menstrual period cramps.   Avoiding becoming pregnant.   Regulating the menstrual cycle.   Treating acne.   Decreasing the heavy menstrual flow.   Treating dysfunctional (abnormal) uterine bleeding.    Treating chronic pelvic pain.   Treating polycystic ovary syndrome (ovary does not ovulate and produces tiny cysts).   Treating endometriosis (uterus lining growing in the pelvis, tubes, and ovaries).   Can be used for emergency contraception.   OCs DO NOT prevent sexually transmitted diseases (STDs). Safer sex practices, such as using condoms along with the pill, can help prevent STDs.  BENEFITS  OC reduces the risk of:   Cancer of the ovary and uterus.   Ovarian cysts.   Pelvic infection.   Symptoms of polycystic ovary syndrome.   Loss of bone (osteoporosis).   Noncancerous (benign) breast disease (fibrocystic breast changes).   Lack of red blood cells (anemia) from heavy or long menstrual periods.   Pregnancy occurring outside the uterus (tubal pregnancy).   Acne.   Slows down the flow of heavy menstrual periods.   Sometimes helps control premenstrual syndrome (PMS).   Stops menstrual cramps and pain.   Controls irregular menstrual periods.   Can be used as emergency contraception.  YOU SHOULD NOT TAKE THE PILL IF YOU:  Are pregnant, or are trying to get pregnant.  Have unexplained or abnormal vaginal bleeding.   Have a history of liver disease, stroke, or heart attack.   Smoke.   Have a history of blood clots, cancer, or heart problems.   Have gallbladder disease.   Have breast cancer or suspect breast cancer.   Have or suspect pelvic cancer.   Have high blood pressure.  Have high cholesterol or high triglycerides.   Have mental depression.   Are breast-feeding, except for the progesterone only OC, with approval of your caregiver.   Have diabetes with kidney, eye, or other blood vessel complications. Or if you have diabetes for 20 years or more.   Have heart valve disease.   Have migraine headaches. They may get worse.   Before taking the pill, a woman will have a physical exam and Pap test. Your caregiver may order blood tests to check  blood sugar and cholesterol levels, and other blood tests that may be necessary. SIDE EFFECTS OF THE PILL MAY INCLUDE:  Breast tenderness, pain and discharge.   Change in sex drive (increased or decreased libido).   Depression.   Being tired often.   Headaches.   Anxiety.   Irregular spotting or vaginal bleeding for a couple of months.  Leg pain.   Cramps, or swelling of your limbs (extremities).   Mood swings.   Weight loss or weight gain.   Feeling sick to your stomach (nausea).   Change in appetite (hunger).  Loss of hair.   Yeast or fungus vaginal infection.   Nervousness.   Rash.   Acne.   No menstrual period (amenorrhea).     When starting an OC, it is usually best to allow 2-3 months, if possible, for the body to adjust (before stopping because of side effects). This allows for adjustment to the changes in hormone levels. If a woman continues to have side effects, it may be possible to change to a different OC. It is important to discuss side effects with your caregiver. Often, changing to a different pill causes the side effects to subside. RISKS AND COMPLICATIONS  Blood clots of the leg, heart, lung, or brain.  High blood pressure.   Gallbladder disease.  Liver tumors.   Brain bleeding (hemorrhage).  Slight risk of breast cancer.   HOME CARE INSTRUCTIONS  Do not smoke.   Only take over-the-counter or prescription medicines for pain, discomfort, fever, or breast tenderness as directed by your caregiver.   Always use a condom to protect against sexually transmitted disease. OCs do not protect against STDs.   Keep a calendar, marking your menstrual period days.  Recommendations, types, and dosages of OC use change continually. Discuss your choices with your caregiver, and decide what is best for you. There are always exceptions to guidelines. You should always read the information that comes with the OC, and check whether there are any new  recommendations or guidelines. SEEK MEDICAL CARE IF:  You develop nausea and vomiting from the OC.   You have abnormal vaginal discharge.   You need treatment for headaches.   You develop a rash.   You miss your menstrual period.   You develop abnormal vaginal bleeding.   You are losing your hair.   You need treatment for mood swings or depression.   You get dizzy when taking the OC.   You develop acne from taking the OC.  SEEK IMMEDIATE MEDICAL CARE IF:  You develop leg pain.   You develop chest pain.   You develop shortness of breath.   You develop abdominal pain.   You have an uncontrolled headache.   You develop numbness or slurred speech.   You develop visual problems (loss of vision, double or blurry vision).   You develop heavy vaginal bleeding.  If you are taking the pill, STOP RIGHT AWAY and CALL YOUR CAREGIVER IMMEDIATELY if the following occur:  You develop chest pain and shortness of breath.   You develop pain, redness, and swelling in the legs.   You develop severe headaches, visual changes, or belly (abdominal) pain.   You develop severe depression.   You become pregnant.  Document Released: 02/14/2003 Document Re-Released: 12/16/2009 [employer] Patient Information 2011 [employer]. 

## 2011-08-19 ENCOUNTER — Ambulatory Visit: Payer: Medicaid Other | Admitting: Family Medicine

## 2011-08-22 ENCOUNTER — Emergency Department (HOSPITAL_BASED_OUTPATIENT_CLINIC_OR_DEPARTMENT_OTHER)
Admission: EM | Admit: 2011-08-22 | Discharge: 2011-08-22 | Disposition: A | Discharge status: Home / Self Care | Payer: Medicaid Other | Attending: Emergency Medicine | Admitting: Emergency Medicine

## 2011-08-22 ENCOUNTER — Encounter (HOSPITAL_BASED_OUTPATIENT_CLINIC_OR_DEPARTMENT_OTHER): Payer: Self-pay | Admitting: *Deleted

## 2011-08-22 ENCOUNTER — Emergency Department (INDEPENDENT_AMBULATORY_CARE_PROVIDER_SITE_OTHER): Payer: Medicaid Other

## 2011-08-22 DIAGNOSIS — J069 Acute upper respiratory infection, unspecified: Secondary | ICD-10-CM | POA: Insufficient documentation

## 2011-08-22 DIAGNOSIS — R05 Cough: Secondary | ICD-10-CM

## 2011-08-22 DIAGNOSIS — J029 Acute pharyngitis, unspecified: Secondary | ICD-10-CM | POA: Insufficient documentation

## 2011-08-22 LAB — RAPID STREP SCREEN (MED CTR MEBANE ONLY): Streptococcus, Group A Screen (Direct): NEGATIVE

## 2011-08-22 MED ORDER — SULFAMETHOXAZOLE-TRIMETHOPRIM 800-160 MG PO TABS: 1.0000 | ORAL_TABLET | Freq: Two times a day (BID) | ORAL | Status: AC

## 2011-08-22 NOTE — ED Provider Notes (Addendum)
History     CSN: [id number] Arrival date & time: 08/22/2011  8:03 AM   Chief Complaint  Patient presents with  . Sore Throat     (Include location/radiation/quality/duration/timing/severity/associated sxs/prior treatment) HPI  The patient complains of sinus pain, pressure, and congestion with a cough, and yellow sputum.  She says that the pain is moderate.  She has not used any medications to resolve her symptoms.  Previous Past Medical History  Diagnosis Date  . Gallstones   . Cervix, short (affecting pregnancy) 03/28/2011  . Anemia      Past Surgical History  Procedure Date  . Cholecystectomy   . Wisdom tooth extraction     Family History  Problem Relation Age of Onset  . Fibroids Mother   . Diabetes Father   . Crohn's disease Father   . Cancer Maternal Grandmother     History  Substance Use Topics  . Smoking status: Current Everyday Smoker -- 0.2 packs/day for 7 years    Types: Cigarettes  . Smokeless tobacco: Never Used  . Alcohol Use: No    OB History    Grav Para Term Preterm Abortions TAB SAB Ect Mult Living   2 2 1 1  0 0 0 0 0 2      Review of Systems  Constitutional: Negative for fever and chills.  HENT: Positive for congestion.   Respiratory: Positive for cough.   Gastrointestinal: Negative for nausea and vomiting.  Skin: Negative for rash.  Psychiatric/Behavioral: Negative for confusion.    Allergies  Review of patient's allergies indicates no known allergies.  Home Medications   Current Outpatient Rx  Name Route Sig Dispense Refill  . NORGESTIM-ETH ESTRAD TRIPHASIC 0.18/0.215/0.25 MG-35 MCG PO TABS Oral Take 1 tablet by mouth daily. 1 Package 11  . PRENATE ELITE 90-600-400 MG-MCG-MCG PO TABS Oral Take 1 tablet by mouth daily.     . SUMATRIPTAN SUCCINATE 100 MG PO TABS Oral Take 1 tablet (100 mg total) by mouth once as needed for migraine. 9 tablet 11    Physical Exam    BP 111/75  Pulse 87  Temp(Src) 97.7 F (36.5 C) (Oral)   Resp 20  SpO2 100%  LMP 09/24/2010  Breastfeeding? Yes  Physical Exam  Constitutional: She is oriented to person, place, and time. She appears well-developed and well-nourished.  HENT:  Head: Normocephalic and atraumatic.  Eyes: Pupils are equal, round, and reactive to light.  Neck: Normal range of motion. Neck supple.  Pulmonary/Chest: Effort normal.  Abdominal: She exhibits no distension.  Musculoskeletal: Normal range of motion.  Neurological: She is alert and oriented to person, place, and time.  Skin: Skin is warm and dry.  Psychiatric: She has a normal mood and affect.    ED Course  Procedures  Results for orders placed during the hospital encounter of 08/22/11  RAPID STREP SCREEN      Component Value Range   Streptococcus, Group A Screen (Direct) NEGATIVE  NEGATIVE    Dg Chest 2 View  08/22/2011  *RADIOLOGY REPORT*  Clinical Data: Productive cough.  CHEST - 2 VIEW 08/22/2011:  Comparison: Two-view chest x-ray 12/04/2008 [facility].  Findings: Cardiomediastinal silhouette unremarkable.  Lungs clear. Bronchovascular markings normal.  Pulmonary vascularity normal.  No pleural effusions.  No pneumothorax.  Visualized bony thorax intact.  No evidence of recurrent pneumonia.  IMPRESSION: Normal examination.  Original Report Authenticated By: Arnell Sieving, M.D.     1. Acute upper respiratory infections of unspecified site  MDM Upper respiratory infect.  No pneumonia.  No signs of strep throat, nontoxic, with no evidence of respiratory distress       Nicholes Stairs, MD 08/22/11 [id number]  Nicholes Stairs, MD 08/22/11 [id number]  Nicholes Stairs, MD 09/07/11 [phone]

## 2011-08-22 NOTE — ED Notes (Signed)
Pt c/o sinus pain, pressure and congestion, cough productive of yellow sputum and pain throughout her arms and legs.

## 2011-08-23 ENCOUNTER — Emergency Department (HOSPITAL_BASED_OUTPATIENT_CLINIC_OR_DEPARTMENT_OTHER)
Admission: EM | Admit: 2011-08-23 | Discharge: 2011-08-23 | Disposition: A | Discharge status: Home / Self Care | Payer: Medicaid Other | Attending: Emergency Medicine | Admitting: Emergency Medicine

## 2011-08-23 ENCOUNTER — Encounter (HOSPITAL_BASED_OUTPATIENT_CLINIC_OR_DEPARTMENT_OTHER): Payer: Self-pay | Admitting: *Deleted

## 2011-08-23 DIAGNOSIS — F17200 Nicotine dependence, unspecified, uncomplicated: Secondary | ICD-10-CM | POA: Insufficient documentation

## 2011-08-23 DIAGNOSIS — J40 Bronchitis, not specified as acute or chronic: Secondary | ICD-10-CM

## 2011-08-23 DIAGNOSIS — R0602 Shortness of breath: Secondary | ICD-10-CM | POA: Insufficient documentation

## 2011-08-23 MED ORDER — PREDNISONE 10 MG PO TABS: 40.0000 mg | ORAL_TABLET | Freq: Every day | ORAL | Status: AC

## 2011-08-23 MED ORDER — DESLORATADINE-PSEUDOEPHED ER 5-240 MG PO TB24: 1.0000 | ORAL_TABLET | Freq: Every day | ORAL | Status: DC

## 2011-08-23 MED ORDER — ALBUTEROL SULFATE (5 MG/ML) 0.5% IN NEBU
5.0000 mg | INHALATION_SOLUTION | Freq: Once | RESPIRATORY_TRACT | Status: AC
  Administered 2011-08-23: 5 mg via RESPIRATORY_TRACT

## 2011-08-23 MED ORDER — PREDNISONE 20 MG PO TABS
60.0000 mg | ORAL_TABLET | Freq: Once | ORAL | Status: AC
  Administered 2011-08-23: 60 mg via ORAL
  Filled 2011-08-23: qty 3

## 2011-08-23 MED ORDER — ALBUTEROL SULFATE HFA 108 (90 BASE) MCG/ACT IN AERS
2.0000 | INHALATION_SPRAY | Freq: Once | RESPIRATORY_TRACT | Status: AC
  Administered 2011-08-23: 2 via RESPIRATORY_TRACT
  Filled 2011-08-23: qty 6.7

## 2011-08-23 MED ORDER — ALBUTEROL SULFATE (5 MG/ML) 0.5% IN NEBU
INHALATION_SOLUTION | RESPIRATORY_TRACT | Status: AC
  Administered 2011-08-23: 5 mg via RESPIRATORY_TRACT
  Filled 2011-08-23: qty 1

## 2011-08-23 MED ORDER — IPRATROPIUM BROMIDE 0.02 % IN SOLN
0.5000 mg | Freq: Once | RESPIRATORY_TRACT | Status: AC
  Administered 2011-08-23: 0.5 mg via RESPIRATORY_TRACT

## 2011-08-23 MED ORDER — ALBUTEROL SULFATE (5 MG/ML) 0.5% IN NEBU
INHALATION_SOLUTION | RESPIRATORY_TRACT | Status: AC
  Administered 2011-08-23: 20:00:00
  Filled 2011-08-23: qty 1

## 2011-08-23 MED ORDER — IBUPROFEN 800 MG PO TABS
800.0000 mg | ORAL_TABLET | Freq: Once | ORAL | Status: AC
  Administered 2011-08-23: 800 mg via ORAL
  Filled 2011-08-23: qty 1

## 2011-08-23 MED ORDER — IPRATROPIUM BROMIDE 0.02 % IN SOLN
RESPIRATORY_TRACT | Status: AC
  Administered 2011-08-23: 20:00:00
  Filled 2011-08-23: qty 2.5

## 2011-08-23 NOTE — ED Provider Notes (Signed)
Scribed for Barbara Bailiff, MD, the patient was seen in room MH08/MH08 . This chart was scribed by Ellie Lunch. This patient's care was started at 8:42 PM.   CSN: [id number] Arrival date & time: 08/23/2011  7:28 PM  Chief Complaint  Patient presents with  . Shortness of Breath   (Include location/radiation/quality/duration/timing/severity/associated sxs/prior treatment) HPI Barbara Moses is a 25 y.o. female who presents to the Emergency Department complaining of SOB. Pt was seen in ED yesterday and Dx'd wiith URI. Pt reports her sx have not improved and she has developed n/v with 2 episodes today and SOB. Pt reports her teeth are also still painful and "aching".  Pt began taking abx starting yesterday.   HPI ELEMENTS:  Location: upper respiratory  Onset: today   Context: as above  Associated symptoms: URI sx, cough, n/v  Past Medical History  Diagnosis Date  . Gallstones   . Cervix, short (affecting pregnancy) 03/28/2011  . Anemia     Past Surgical History  Procedure Date  . Cholecystectomy   . Wisdom tooth extraction     Family History  Problem Relation Age of Onset  . Fibroids Mother   . Diabetes Father   . Crohn's disease Father   . Cancer Maternal Grandmother     History  Substance Use Topics  . Smoking status: Current Everyday Smoker -- 0.2 packs/day for 7 years    Types: Cigarettes  . Smokeless tobacco: Never Used  . Alcohol Use: No    OB History    Grav Para Term Preterm Abortions TAB SAB Ect Mult Living   2 2 1 1  0 0 0 0 0 2     Review of Systems 10 Systems reviewed and are negative for acute change except as noted in the HPI.  Allergies  Review of patient's allergies indicates no known allergies.  Home Medications   Current Outpatient Rx  Name Route Sig Dispense Refill  . NORGESTIM-ETH ESTRAD TRIPHASIC 0.18/0.215/0.25 MG-35 MCG PO TABS Oral Take 1 tablet by mouth daily. 1 Package 11  . SULFAMETHOXAZOLE-TRIMETHOPRIM 800-160 MG PO TABS Oral Take 1  tablet by mouth every 12 (twelve) hours. 20 tablet 0  . SUMATRIPTAN SUCCINATE 100 MG PO TABS Oral Take 1 tablet (100 mg total) by mouth once as needed for migraine. 9 tablet 11  . DESLORATADINE-PSEUDOEPHEDRINE 5-240 MG PO TB24 Oral Take 1 tablet by mouth daily. 30 tablet 0  . PREDNISONE 10 MG PO TABS Oral Take 4 tablets (40 mg total) by mouth daily. 20 tablet 0  . PRENATE ELITE 90-600-400 MG-MCG-MCG PO TABS Oral Take 1 tablet by mouth daily.       Physical Exam    BP 117/69  Pulse 104  Temp(Src) 98.9 F (37.2 C) (Oral)  Resp 24  Ht 5' 2.5" (1.588 m)  Wt 150 lb (68.04 kg)  BMI 27.00 kg/m2  SpO2 95%  LMP 09/24/2010  Breastfeeding? Yes  Physical Exam  Nursing note and vitals reviewed. Constitutional: She is oriented to person, place, and time. She appears well-developed and well-nourished.  HENT:  Head: Normocephalic and atraumatic.       No facial sinus tenderness  Eyes: Conjunctivae and EOM are normal. Pupils are equal, round, and reactive to light.  Neck: Normal range of motion. Neck supple.  Cardiovascular: Regular rhythm and normal heart sounds.   Pulmonary/Chest: Effort normal. She has wheezes (faint).  Abdominal: Soft. There is no tenderness.  Musculoskeletal: Normal range of motion.  Neurological: She is alert and  oriented to person, place, and time.  Skin: Skin is warm and dry.  Psychiatric: She has a normal mood and affect.   Procedures  OTHER DATA REVIEWED: Nursing notes, vital signs, and past medical records reviewed.  Dg Chest 2 View  08/22/2011  *RADIOLOGY REPORT*  Clinical Data: Productive cough.  CHEST - 2 VIEW 08/22/2011:  Comparison: Two-view chest x-ray 12/04/2008 [facility].  Findings: Cardiomediastinal silhouette unremarkable.  Lungs clear. Bronchovascular markings normal.  Pulmonary vascularity normal.  No pleural effusions.  No pneumothorax.  Visualized bony thorax intact.  No evidence of recurrent pneumonia.  IMPRESSION: Normal examination.   Original Report Authenticated By: Arnell Sieving, M.D.    DIAGNOSTIC STUDIES: Oxygen Saturation is 95% on room air, adequate by my interpretation.    ED COURSE / COORDINATION OF CARE: EDP ordered the following medications: Medications  albuterol (PROVENTIL) (5 MG/ML) 0.5% nebulizer solution (   Given 08/23/11 1945)  ipratropium (ATROVENT) 0.02 % nebulizer solution (   Given 08/23/11 1945)  albuterol (PROVENTIL) (5 MG/ML) 0.5% nebulizer solution 5 mg (5 mg Nebulization Given 08/23/11 2045)   20:48 EDP at PT bedside. Pt reports improvement with breathing treatment.  MDM: The patient's symptoms can be explained by bronchitis. She received 2 breathing treatments one emergency department with improvement of her symptoms. She had resolution of her shortness of breath as well as her wheezing. She has no signs of respiratory distress at this time. I also administered a dose of prednisone. I instructed the patient to continue the antibiotics which were prescribed at her last visit. She'll be provided an albuterol inhaler to use every 4 hours as needed. I will prescribe a decongestant as well as a steroid burst. She is instructed to follow up with her primary care physician this week. She provided signs and symptoms for which to return the emergency department.  SCRIBE ATTESTATION: I personally performed the services described in this documentation, which was scribed in my presence. The recorded information has been reviewed and considered.           Barbara Bailiff, MD 08/23/11 2222

## 2011-08-23 NOTE — ED Notes (Signed)
Pt states she was seen here yesterday and dx'd with a URI. Today has felt increasingly SHOB. Taking antibx, but also continues to have tooth pain. Also c/o nausea and vomiting.

## 2011-08-25 ENCOUNTER — Ambulatory Visit: Payer: Medicaid Other | Admitting: Obstetrics and Gynecology

## 2011-08-26 ENCOUNTER — Ambulatory Visit: Payer: Medicaid Other | Admitting: Obstetrics & Gynecology

## 2011-08-27 ENCOUNTER — Ambulatory Visit: Payer: Medicaid Other | Admitting: Obstetrics and Gynecology

## 2011-08-30 ENCOUNTER — Emergency Department (INDEPENDENT_AMBULATORY_CARE_PROVIDER_SITE_OTHER): Payer: Medicaid Other

## 2011-08-30 ENCOUNTER — Emergency Department (HOSPITAL_BASED_OUTPATIENT_CLINIC_OR_DEPARTMENT_OTHER)
Admission: EM | Admit: 2011-08-30 | Discharge: 2011-08-30 | Disposition: A | Discharge status: Home / Self Care | Payer: Medicaid Other | Attending: Emergency Medicine | Admitting: Emergency Medicine

## 2011-08-30 ENCOUNTER — Encounter (HOSPITAL_BASED_OUTPATIENT_CLINIC_OR_DEPARTMENT_OTHER): Payer: Self-pay | Admitting: *Deleted

## 2011-08-30 DIAGNOSIS — I82890 Acute embolism and thrombosis of other specified veins: Secondary | ICD-10-CM

## 2011-08-30 DIAGNOSIS — R1084 Generalized abdominal pain: Secondary | ICD-10-CM

## 2011-08-30 DIAGNOSIS — Z79899 Other long term (current) drug therapy: Secondary | ICD-10-CM | POA: Insufficient documentation

## 2011-08-30 DIAGNOSIS — R112 Nausea with vomiting, unspecified: Secondary | ICD-10-CM

## 2011-08-30 DIAGNOSIS — R109 Unspecified abdominal pain: Secondary | ICD-10-CM | POA: Insufficient documentation

## 2011-08-30 DIAGNOSIS — N946 Dysmenorrhea, unspecified: Secondary | ICD-10-CM

## 2011-08-30 LAB — BASIC METABOLIC PANEL
CO2: 24 mEq/L (ref 19–32)
Calcium: 9.6 mg/dL (ref 8.4–10.5)
GFR calc Af Amer: >60 mL/min (ref >60)
GFR calc non Af Amer: >60 mL/min (ref >60)
Sodium: 139 mEq/L (ref 135–145)

## 2011-08-30 LAB — CBC
MCH: 30.1 pg (ref 26.0–34.0)
Platelets: 324 10*3/uL (ref 150–400)
RBC: 4.25 MIL/uL (ref 3.87–5.11)
RDW: 12.5 % (ref 11.5–15.5)
WBC: 17 10*3/uL — ABNORMAL HIGH (ref 4.0–10.5)

## 2011-08-30 LAB — PREGNANCY, URINE: Preg Test, Ur: NEGATIVE

## 2011-08-30 LAB — URINALYSIS, ROUTINE W REFLEX MICROSCOPIC
Ketones, ur: 15 mg/dL — AB
Leukocytes, UA: NEGATIVE
Nitrite: NEGATIVE
Protein, ur: NEGATIVE mg/dL
Urobilinogen, UA: 0.2 mg/dL (ref 0.0–1.0)
pH: 5.5 (ref 5.0–8.0)

## 2011-08-30 MED ORDER — SODIUM CHLORIDE 0.9 % IV BOLUS (SEPSIS)
250.0000 mL | Freq: Once | INTRAVENOUS | Status: AC
  Administered 2011-08-30: 250 mL via INTRAVENOUS

## 2011-08-30 MED ORDER — IOHEXOL 300 MG/ML  SOLN
100.0000 mL | Freq: Once | INTRAMUSCULAR | Status: AC | PRN
  Administered 2011-08-30: 100 mL via INTRAVENOUS

## 2011-08-30 MED ORDER — ONDANSETRON HCL 4 MG/2ML IJ SOLN
4.0000 mg | Freq: Once | INTRAMUSCULAR | Status: AC
  Administered 2011-08-30: 4 mg via INTRAVENOUS
  Filled 2011-08-30: qty 2

## 2011-08-30 MED ORDER — HYDROCODONE-ACETAMINOPHEN 5-325 MG PO TABS: 1.0000 | ORAL_TABLET | ORAL | Status: AC | PRN

## 2011-08-30 MED ORDER — HYDROMORPHONE HCL 1 MG/ML IJ SOLN
1.0000 mg | Freq: Once | INTRAMUSCULAR | Status: AC
  Administered 2011-08-30: 1 mg via INTRAVENOUS
  Filled 2011-08-30: qty 1

## 2011-08-30 MED ORDER — NALOXONE HCL 0.4 MG/ML IJ SOLN: 0.4000 mg | Freq: Once | INTRAMUSCULAR | Status: DC

## 2011-08-30 MED ORDER — SODIUM CHLORIDE 0.9 % IV SOLN: INTRAVENOUS | Status: DC

## 2011-08-30 NOTE — ED Notes (Signed)
Pt states that she has not had a 2 week post partum f/u. Started bleeding heavy this a.m. With cramping and pain in lower abd. Hurts to pee.

## 2011-08-30 NOTE — ED Provider Notes (Signed)
Scribed for Shelda Jakes, MD, the patient was seen in room MH10/MH10. This chart was scribed by [employer]. The patient's care started at 14:36  CSN: [id number] Arrival date & time: 08/30/2011  2:17 PM  Chief Complaint  Patient presents with  . Abdominal Pain    HPI   Patient is a 25 y.o. female presenting with abdominal pain.  Abdominal Pain The primary symptoms of the illness include abdominal pain, nausea and vomiting. The primary symptoms of the illness do not include diarrhea.  Symptoms associated with the illness do not include back pain.    Barbara Moses is a 25 y.o. female who presents to the Emergency Department complaining ofabdominal pain. Describes pain as "all over my stomach and agonizing". Pain does not feel like menstrual cramps. Reports she started taking birth control pills after her delivery two months ago and reports spotted vaginal bleeding for the past couple of days with heavy vaginal bleeding this morning. States that it hurts to urinate and pain is aggravated by coughing. Reports diaphoresis, nausea, but denies rash, back pain or chest pain. Denies breast feeding baby. There are no other associated symptoms and no other alleviating or aggravating factors.    HPI ELEMENTS:  Location: Abdomen  Onset: 08/30/2011 Timing: intermittent Quality: Agonizing  Modifying factors: aggravated by coughing  Context:  as above  Associated symptoms: as above    Past Medical History  Diagnosis Date  . Gallstones   . Cervix, short (affecting pregnancy) 03/28/2011  . Anemia     Past Surgical History  Procedure Date  . Cholecystectomy   . Wisdom tooth extraction     Family History  Problem Relation Age of Onset  . Fibroids Mother   . Diabetes Father   . Crohn's disease Father   . Cancer Maternal Grandmother     History  Substance Use Topics  . Smoking status: Current Everyday Smoker -- 0.2 packs/day for 7 years    Types: Cigarettes  . Smokeless  tobacco: Never Used  . Alcohol Use: No    OB History    Grav Para Term Preterm Abortions TAB SAB Ect Mult Living   2 2 1 1  0 0 0 0 0 2      Review of Systems  Review of Systems  Cardiovascular: Negative for chest pain.  Gastrointestinal: Positive for nausea, vomiting and abdominal pain. Negative for diarrhea.  Musculoskeletal: Negative for back pain.  Skin: Negative for rash.  All other systems reviewed and are negative.    Allergies  Review of patient's allergies indicates no known allergies.  Home Medications   Current Outpatient Rx  Name Route Sig Dispense Refill  . DESLORATADINE-PSEUDOEPHEDRINE 5-240 MG PO TB24 Oral Take 1 tablet by mouth daily. 30 tablet 0  . NORGESTIM-ETH ESTRAD TRIPHASIC 0.18/0.215/0.25 MG-35 MCG PO TABS Oral Take 1 tablet by mouth daily. 1 Package 11  . PREDNISONE 10 MG PO TABS Oral Take 4 tablets (40 mg total) by mouth daily. 20 tablet 0  . PRENATE ELITE 90-600-400 MG-MCG-MCG PO TABS Oral Take 1 tablet by mouth daily.     . SULFAMETHOXAZOLE-TRIMETHOPRIM 800-160 MG PO TABS Oral Take 1 tablet by mouth every 12 (twelve) hours. 20 tablet 0  . SUMATRIPTAN SUCCINATE 100 MG PO TABS Oral Take 1 tablet (100 mg total) by mouth once as needed for migraine. 9 tablet 11    Physical Exam    BP 119/73  Pulse 120  Temp(Src) 98.3 F (36.8 C) (Oral)  Resp 20  Ht 5' 2.5" (1.588 m)  Wt 151 lb (68.493 kg)  BMI 27.18 kg/m2  SpO2 95%  LMP 08/30/2011  Breastfeeding? No  Physical Exam  Nursing note and vitals reviewed. Constitutional: She is oriented to person, place, and time. She appears well-developed and well-nourished. No distress.  HENT:  Head: Normocephalic and atraumatic.  Eyes: Conjunctivae and EOM are normal. Pupils are equal, round, and reactive to light.  Neck: Neck supple. No tracheal deviation present.  Cardiovascular: Normal rate, regular rhythm and normal heart sounds.  Exam reveals no gallop and no friction rub.   No murmur  heard. Pulmonary/Chest: Effort normal and breath sounds normal. She has no wheezes. She exhibits no tenderness.  Abdominal: Soft. Bowel sounds are normal. She exhibits no distension. There is no tenderness.  Musculoskeletal: Normal range of motion. She exhibits no edema.  Lymphadenopathy:    She has no cervical adenopathy.  Neurological: She is alert and oriented to person, place, and time. No cranial nerve deficit or sensory deficit.  Skin: Skin is warm and dry. No rash noted. No erythema.  Psychiatric: She has a normal mood and affect. Her behavior is normal.    ED Course  Procedures  OTHER DATA REVIEWED: Nursing notes, vital signs, and past medical records reviewed.    DIAGNOSTIC STUDIES: Oxygen Saturation is 95% on room air, normal by my interpretation.    LABS / RADIOLOGY:  Results for orders placed during the hospital encounter of 08/30/11  CBC      Component Value Range   WBC 17.0 (*) 4.0 - 10.5 (K/uL)   RBC 4.25  3.87 - 5.11 (MIL/uL)   Hemoglobin 12.8  12.0 - 15.0 (g/dL)   HCT 16.1  09.6 - 04.5 (%)   MCV 87.5  78.0 - 100.0 (fL)   MCH 30.1  26.0 - 34.0 (pg)   MCHC 34.4  30.0 - 36.0 (g/dL)   RDW 40.9  81.1 - 91.4 (%)   Platelets 324  150 - 400 (K/uL)  BASIC METABOLIC PANEL      Component Value Range   Sodium 139  135 - 145 (mEq/L)   Potassium 3.5  3.5 - 5.1 (mEq/L)   Chloride 102  96 - 112 (mEq/L)   CO2 24  19 - 32 (mEq/L)   Glucose, Bld 122 (*) 70 - 99 (mg/dL)   BUN 8  6 - 23 (mg/dL)   Creatinine, Ser 7.82  0.50 - 1.10 (mg/dL)   Calcium 9.6  8.4 - 95.6 (mg/dL)   GFR calc non Af Amer >60  >60 (mL/min)   GFR calc Af Amer >60  >60 (mL/min)  PREGNANCY, URINE      Component Value Range   Preg Test, Ur NEGATIVE    URINALYSIS, ROUTINE W REFLEX MICROSCOPIC      Component Value Range   Color, Urine YELLOW  YELLOW    Appearance CLEAR  CLEAR    Specific Gravity, Urine 1.019  1.005 - 1.030    pH 5.5  5.0 - 8.0    Glucose, UA NEGATIVE  NEGATIVE (mg/dL)   Hgb urine  dipstick MODERATE (*) NEGATIVE    Bilirubin Urine NEGATIVE  NEGATIVE    Ketones, ur 15 (*) NEGATIVE (mg/dL)   Protein, ur NEGATIVE  NEGATIVE (mg/dL)   Urobilinogen, UA 0.2  0.0 - 1.0 (mg/dL)   Nitrite NEGATIVE  NEGATIVE    Leukocytes, UA NEGATIVE  NEGATIVE   URINE MICROSCOPIC-ADD ON      Component Value Range   Squamous Epithelial /  LPF RARE  RARE    WBC, UA 0-2  <3 (WBC/hpf)   RBC / HPF 7-10  <3 (RBC/hpf)   Bacteria, UA MANY (*) RARE    Urine-Other MUCOUS PRESENT      Ct Abdomen Pelvis W Contrast  08/30/2011  *RADIOLOGY REPORT*  Clinical Data: Status post vaginal child birth 2 months ago. Abdominal pain all over since 11:30 am.  Nausea vomiting. Cholecystectomy.  CT ABDOMEN AND PELVIS WITH CONTRAST  Technique:  Multidetector CT imaging of the abdomen and pelvis was performed following the standard protocol during bolus administration of intravenous contrast.  Contrast: OMNIPAQUE IOHEXOL 300 MG/ML IV SOLN  Comparison: MRI of 05/03/2011  Findings: Clear lung bases.  Normal heart size without pericardial or pleural effusion.  Normal liver, spleen, stomach, pancreas. Cholecystectomy without biliary ductal dilatation.  Normal adrenal glands and kidneys.  A retroaortic left renal vein. No retroperitoneal or retrocrural adenopathy.  Normal colon and terminal ileum.  The appendix is normal, including on image 68.  Normal small bowel without abdominal ascites.  There is enlargement of and a filling defect within the right gonadal vein, consistent with acute or subacute thrombus/thrombophlebitis.  Example images 33 - 48 of series 2. Minimal surrounding edema. No pelvic adenopathy. Normal urinary bladder.  Normal uterus without adnexal mass or significant free pelvic fluid. No acute osseous abnormality.  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  Surrounding edema, most consistent with thrombophlebitis.  No other explanation for abdominal pain.  Original Report Authenticated By:  Consuello Bossier, M.D.    ED COURSE / COORDINATION OF CARE: 14:50 - EDMD examined patient and ordered the following Orders Placed This Encounter  Procedures  . CT Abdomen Pelvis W Contrast  . CBC  . Basic metabolic panel  . Pregnancy, urine  . Urinalysis with microscopic  . Urine microscopic-add on  . Consult to obstetrics / gynecology  18:08 EDMD consult with obstetrics/gynecology to discuss patient's Ct abdomen results. Patient is in no distress and is sitting up on bed.   MDM:   CT FINDINGS DW DR DOVE AND NOT SIGNIFICANT THEY WILL FOLLOW UP . 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 Patient to be sent home with pain medications. Patient to follow up with Dr Olivia Mackie practice in the next day or two. The patient is to return the emergency department if there is any worsening of symptoms. I have reviewed the discharge instructions with the patient.  CONDITION ON DISCHARGE: Stable  MEDICATIONS GIVEN IN THE E.D.  Medications  0.9 %  sodium chloride infusion (not administered)  sodium chloride 0.9 % bolus 250 mL (250 mL Intravenous Given 08/30/11 1516)  HYDROmorphone (DILAUDID) injection 1 mg (1 mg Intravenous Given 08/30/11 1517)  ondansetron (ZOFRAN) injection 4 mg (4 mg Intravenous Given 08/30/11 1517)  iohexol (OMNIPAQUE) 300 MG/ML injection 100 mL (100 mL Intravenous Contrast Given 08/30/11 1636)    DISCHARGE MEDICATIONS: New Prescriptions   No medications on file    SCRIBE ATTESTATION:  I personally performed the services described in this documentation, which was scribed in my presence. The recorded information has been reviewed and considered. Shelda Jakes, MD     Shelda Jakes, MD 08/30/11 [phone]

## 2011-09-01 LAB — POCT PREGNANCY, URINE: Operator id: 159681

## 2011-09-03 ENCOUNTER — Ambulatory Visit (INDEPENDENT_AMBULATORY_CARE_PROVIDER_SITE_OTHER): Payer: Medicaid Other | Admitting: Obstetrics and Gynecology

## 2011-09-03 ENCOUNTER — Encounter: Payer: Self-pay | Admitting: Obstetrics and Gynecology

## 2011-09-03 VITALS — BP 102/72 | HR 113 | Wt 153.0 lb

## 2011-09-03 DIAGNOSIS — I749 Embolism and thrombosis of unspecified artery: Secondary | ICD-10-CM

## 2011-09-03 DIAGNOSIS — I8290 Acute embolism and thrombosis of unspecified vein: Secondary | ICD-10-CM

## 2011-09-03 DIAGNOSIS — Z23 Encounter for immunization: Secondary | ICD-10-CM

## 2011-09-03 MED ORDER — OXYCODONE-ACETAMINOPHEN 7.5-500 MG PO TABS: 1.0000 | ORAL_TABLET | Freq: Four times a day (QID) | ORAL | Status: DC | PRN

## 2011-09-03 MED ORDER — ENOXAPARIN SODIUM 150 MG/ML ~~LOC~~ SOLN: 1.5000 mg/kg | Freq: Every day | SUBCUTANEOUS | Status: DC

## 2011-09-03 MED ORDER — WARFARIN SODIUM 5 MG PO TABS: 5.0000 mg | ORAL_TABLET | Freq: Every day | ORAL | Status: DC

## 2011-09-03 NOTE — Progress Notes (Signed)
  Subjective:    Patient ID: Barbara Moses, female    DOB: 08/20/1986, 25 y.o.   MRN: [id number]  HPI  25 yo G2P1102 s/p vaginal delivery 2 months and started on OCP at her six week postpartum check presented today as an ER follow-up. Patient was seen in Ed on 9/22 secondary to abdominal pain and diagnosed with right gonadal vein thrombosis. Patient is doing well today reporting some persistent RLQ pain. Denies fever, chills, nausea or emesis. Good appetite  Review of Systems     Objective:   Physical Exam  GENERAL: Well-developed, well-nourished female in no acute distress.  LUNGS: Clear to auscultation bilaterally.  HEART: Regular rate and rhythm. ABDOMEN: Soft, nondistended. Mild RLQ pain, no rebound, no guarging. No organomegaly. PELVIC: Normal external female genitalia. Vagina is pink and rugated.  Normal discharge. Normal appearing cervix. Uterus is normal in size. No adnexal mass, mild Right adnexal. EXTREMITIES: No cyanosis, clubbing, or edema, 2+ distal pulses.     Assessment & Plan:  25 yo G2P1102 with right gonadal vein thrombosis - patient advised to stop taking OCP. Other non-hormonal birth control options discussed. Patient opted for the use of condoms for now. - Following discussion with dr. Dalene Carrow (hematologist), will order coagulation work-up and start anticoagulation with Lovenox 1.5mg /kg and coumadin. We will check coumadin until hematology appointment - RTC on Friday for INR check - patient advised to return to ED if worsening pain, fever, CP, SOB

## 2011-09-03 NOTE — Progress Notes (Signed)
Addended by: Barbara Cower on: 09/03/2011 12:11 PM   Modules accepted: Orders

## 2011-09-05 ENCOUNTER — Other Ambulatory Visit (INDEPENDENT_AMBULATORY_CARE_PROVIDER_SITE_OTHER): Payer: Medicaid Other | Admitting: *Deleted

## 2011-09-05 DIAGNOSIS — I749 Embolism and thrombosis of unspecified artery: Secondary | ICD-10-CM

## 2011-09-05 DIAGNOSIS — Z5181 Encounter for therapeutic drug level monitoring: Secondary | ICD-10-CM

## 2011-09-05 NOTE — Progress Notes (Signed)
Patients arrives today for PT/INR check.  She is doing well with her meds and states that she feels a lot better today.  She has an appointment with the hematologist on Monday morning.

## 2011-09-08 ENCOUNTER — Other Ambulatory Visit: Payer: Self-pay | Admitting: Hematology & Oncology

## 2011-09-08 ENCOUNTER — Encounter: Payer: Medicaid Other | Admitting: Hematology & Oncology

## 2011-09-08 ENCOUNTER — Ambulatory Visit (HOSPITAL_BASED_OUTPATIENT_CLINIC_OR_DEPARTMENT_OTHER): Payer: Medicaid Other | Admitting: Hematology & Oncology

## 2011-09-08 DIAGNOSIS — I82890 Acute embolism and thrombosis of other specified veins: Secondary | ICD-10-CM

## 2011-09-08 DIAGNOSIS — F17200 Nicotine dependence, unspecified, uncomplicated: Secondary | ICD-10-CM

## 2011-09-08 DIAGNOSIS — D689 Coagulation defect, unspecified: Secondary | ICD-10-CM

## 2011-09-08 LAB — CBC WITH DIFFERENTIAL (CANCER CENTER ONLY)
BASO#: 0 10*3/uL (ref 0.0–0.2)
EOS%: 10 % — ABNORMAL HIGH (ref 0.0–7.0)
HGB: 12.2 g/dL (ref 11.6–15.9)
LYMPH#: 2.3 10*3/uL (ref 0.9–3.3)
MCHC: 35.2 g/dL (ref 32.0–36.0)
MONO#: 0.4 10*3/uL (ref 0.1–0.9)
NEUT#: 2.7 10*3/uL (ref 1.5–6.5)
RBC: 3.97 10*6/uL (ref 3.70–5.32)
WBC: 6 10*3/uL (ref 3.9–10.0)

## 2011-09-08 LAB — PROTIME-INR (CHCC SATELLITE)
INR: 1.3 — ABNORMAL LOW (ref 2.0–3.5)
Protime: 15.6 Seconds — ABNORMAL HIGH (ref 10.6–13.4)

## 2011-09-09 ENCOUNTER — Ambulatory Visit: Payer: Medicaid Other | Admitting: Hematology & Oncology

## 2011-09-11 LAB — HYPERCOAGULABLE PANEL, COMPREHENSIVE
AntiThromb III Func: 110 % (ref 76–126)
Anticardiolipin IgG: 6 GPL U/mL (ref <23)
Anticardiolipin IgM: 4 MPL U/mL (ref <11)
Beta-2 Glyco I IgG: 1 G Units (ref <20)
Beta-2-Glycoprotein I IgM: 4 M Units (ref <20)
Lupus Anticoagulant: NOT DETECTED
PTT Lupus Anticoagulant: 37.7 secs (ref 30.0–45.6)
Protein C, Total: 100 % (ref 72–160)
Protein S Total: 98 % (ref 60–150)

## 2011-09-12 LAB — DIFFERENTIAL
Eosinophils Relative: 1 % (ref 0–5)
Lymphocytes Relative: 10 % — ABNORMAL LOW (ref 12–46)
Monocytes Absolute: 1 10*3/uL (ref 0.1–1.0)
Monocytes Relative: 5 % (ref 3–12)
Neutrophils Relative %: 80 % — ABNORMAL HIGH (ref 43–77)

## 2011-09-12 LAB — HYPERCOAGULABLE PANEL, COMPREHENSIVE
Anticardiolipin IgA: 5 APL U/mL (ref <22)
Anticardiolipin IgG: 5 GPL U/mL (ref <23)
Beta-2-Glycoprotein I IgA: 3 A Units (ref <20)
DRVVT: 47.7 secs — ABNORMAL HIGH (ref 34.1–42.2)
Lupus Anticoagulant: NOT DETECTED
Protein C Activity: 96 % (ref 75–133)
Protein C, Total: 102 % (ref 72–160)

## 2011-09-12 LAB — PREGNANCY, URINE: Preg Test, Ur: NEGATIVE

## 2011-09-12 LAB — BASIC METABOLIC PANEL
BUN: 9 mg/dL (ref 6–23)
Calcium: 10 mg/dL (ref 8.4–10.5)
Creatinine, Ser: 0.7 mg/dL (ref 0.4–1.2)
GFR calc Af Amer: >60 mL/min (ref >60)
GFR calc non Af Amer: >60 mL/min (ref >60)

## 2011-09-12 LAB — CBC
MCV: 87.7 fL (ref 78.0–100.0)
Platelets: 293 10*3/uL (ref 150–400)
RBC: 4.89 MIL/uL (ref 3.87–5.11)
WBC: 19.4 10*3/uL — ABNORMAL HIGH (ref 4.0–10.5)

## 2011-09-12 LAB — JAK-2 V617F

## 2011-09-12 LAB — D-DIMER, QUANTITATIVE
D-Dimer, Quant: 0.33 ug/mL-FEU (ref 0.00–0.48)
D-Dimer, Quant: 1.26 ug/mL-FEU — ABNORMAL HIGH (ref 0.00–0.48)

## 2011-10-08 ENCOUNTER — Other Ambulatory Visit: Payer: Self-pay | Admitting: Hematology & Oncology

## 2011-10-08 ENCOUNTER — Encounter (HOSPITAL_BASED_OUTPATIENT_CLINIC_OR_DEPARTMENT_OTHER): Payer: Medicaid Other | Admitting: Hematology & Oncology

## 2011-10-08 ENCOUNTER — Ambulatory Visit (HOSPITAL_BASED_OUTPATIENT_CLINIC_OR_DEPARTMENT_OTHER)
Admission: RE | Admit: 2011-10-08 | Discharge: 2011-10-08 | Disposition: A | Discharge status: Home / Self Care | Payer: Medicaid Other | Source: Ambulatory Visit | Attending: Hematology & Oncology | Admitting: Hematology & Oncology

## 2011-10-08 DIAGNOSIS — I82890 Acute embolism and thrombosis of other specified veins: Secondary | ICD-10-CM

## 2011-10-08 DIAGNOSIS — M79605 Pain in left leg: Secondary | ICD-10-CM

## 2011-10-08 DIAGNOSIS — M79609 Pain in unspecified limb: Secondary | ICD-10-CM

## 2011-10-08 LAB — D-DIMER, QUANTITATIVE: D-Dimer, Quant: 0.47 ug/mL-FEU (ref 0.00–0.48)

## 2011-10-08 LAB — CBC WITH DIFFERENTIAL (CANCER CENTER ONLY)
BASO%: 0.4 % (ref 0.0–2.0)
EOS%: 14 % — ABNORMAL HIGH (ref 0.0–7.0)
HCT: 38.1 % (ref 34.8–46.6)
LYMPH%: 42.5 % (ref 14.0–48.0)
MCH: 29.6 pg (ref 26.0–34.0)
MCHC: 35.4 g/dL (ref 32.0–36.0)
MCV: 84 fL (ref 81–101)
MONO#: 0.6 10*3/uL (ref 0.1–0.9)
MONO%: 8.2 % (ref 0.0–13.0)
NEUT%: 34.9 % — ABNORMAL LOW (ref 39.6–80.0)
Platelets: 336 10*3/uL (ref 145–400)
RDW: 13.2 % (ref 11.1–15.7)
WBC: 6.7 10*3/uL (ref 3.9–10.0)

## 2011-10-23 ENCOUNTER — Telehealth: Payer: Self-pay | Admitting: Hematology & Oncology

## 2011-10-23 NOTE — Telephone Encounter (Signed)
Err

## 2011-11-10 ENCOUNTER — Encounter: Payer: Self-pay | Admitting: Hematology & Oncology

## 2011-11-10 NOTE — Progress Notes (Signed)
[employer] supplied patient with 90 days of Lovenox.  This was shipped to our office and the patient picked up Lovenox on 11-07-11.

## 2011-11-11 ENCOUNTER — Ambulatory Visit (HOSPITAL_BASED_OUTPATIENT_CLINIC_OR_DEPARTMENT_OTHER)
Admission: RE | Admit: 2011-11-11 | Discharge: 2011-11-11 | Disposition: A | Discharge status: Home / Self Care | Payer: Medicaid Other | Source: Ambulatory Visit | Attending: Hematology & Oncology | Admitting: Hematology & Oncology

## 2011-11-11 DIAGNOSIS — I82890 Acute embolism and thrombosis of other specified veins: Secondary | ICD-10-CM | POA: Insufficient documentation

## 2011-11-11 DIAGNOSIS — Z0389 Encounter for observation for other suspected diseases and conditions ruled out: Secondary | ICD-10-CM

## 2011-11-11 MED ORDER — IOHEXOL 300 MG/ML  SOLN
100.0000 mL | Freq: Once | INTRAMUSCULAR | Status: AC | PRN
  Administered 2011-11-11: 100 mL via INTRAVENOUS

## 2011-11-12 ENCOUNTER — Telehealth: Payer: Self-pay | Admitting: *Deleted

## 2011-11-12 NOTE — Telephone Encounter (Signed)
Pt called several times wanting the results of her scans from yesterday. After Dr Myna Hidalgo had a chance to review the results she was told that her "blood clot has resolved but will need to stay on the Arixtra for a total of 6 months". She verbalized understanding

## 2011-12-10 ENCOUNTER — Other Ambulatory Visit (HOSPITAL_BASED_OUTPATIENT_CLINIC_OR_DEPARTMENT_OTHER): Payer: Medicaid Other

## 2011-12-11 ENCOUNTER — Ambulatory Visit (HOSPITAL_BASED_OUTPATIENT_CLINIC_OR_DEPARTMENT_OTHER)
Admission: RE | Admit: 2011-12-11 | Discharge: 2011-12-11 | Disposition: A | Discharge status: Home / Self Care | Payer: Medicaid Other | Source: Ambulatory Visit | Attending: Hematology & Oncology | Admitting: Hematology & Oncology

## 2011-12-11 DIAGNOSIS — M79609 Pain in unspecified limb: Secondary | ICD-10-CM | POA: Insufficient documentation

## 2011-12-11 DIAGNOSIS — I82890 Acute embolism and thrombosis of other specified veins: Secondary | ICD-10-CM | POA: Insufficient documentation

## 2011-12-11 MED ORDER — IOHEXOL 350 MG/ML SOLN: 100.0000 mL | Freq: Once | INTRAVENOUS | Status: AC | PRN

## 2011-12-17 ENCOUNTER — Other Ambulatory Visit (HOSPITAL_BASED_OUTPATIENT_CLINIC_OR_DEPARTMENT_OTHER): Payer: Medicaid Other | Admitting: Lab

## 2011-12-17 ENCOUNTER — Encounter: Payer: Self-pay | Admitting: Hematology & Oncology

## 2011-12-17 ENCOUNTER — Other Ambulatory Visit: Payer: Self-pay | Admitting: Hematology & Oncology

## 2011-12-17 ENCOUNTER — Ambulatory Visit (HOSPITAL_BASED_OUTPATIENT_CLINIC_OR_DEPARTMENT_OTHER): Payer: Medicaid Other | Admitting: Hematology & Oncology

## 2011-12-17 DIAGNOSIS — O2230 Deep phlebothrombosis in pregnancy, unspecified trimester: Secondary | ICD-10-CM | POA: Insufficient documentation

## 2011-12-17 DIAGNOSIS — Z8249 Family history of ischemic heart disease and other diseases of the circulatory system: Secondary | ICD-10-CM | POA: Insufficient documentation

## 2011-12-17 DIAGNOSIS — I82890 Acute embolism and thrombosis of other specified veins: Secondary | ICD-10-CM

## 2011-12-17 DIAGNOSIS — M25559 Pain in unspecified hip: Secondary | ICD-10-CM

## 2011-12-17 HISTORY — DX: Family history of ischemic heart disease and other diseases of the circulatory system: Z82.49

## 2011-12-17 LAB — CBC WITH DIFFERENTIAL (CANCER CENTER ONLY)
BASO%: 0.5 % (ref 0.0–2.0)
EOS%: 10.5 % — ABNORMAL HIGH (ref 0.0–7.0)
HCT: 38.8 % (ref 34.8–46.6)
LYMPH%: 42.8 % (ref 14.0–48.0)
MCHC: 34.8 g/dL (ref 32.0–36.0)
MCV: 86 fL (ref 81–101)
NEUT%: 40 % (ref 39.6–80.0)
Platelets: 303 10*3/uL (ref 145–400)
RDW: 14.2 % (ref 11.1–15.7)

## 2011-12-17 NOTE — Progress Notes (Signed)
CC:   Barbara Antigua, MD  DIAGNOSES: 1. Right gonadal vein thrombosis, idiopathic. 2. Strong family history of deep venous thrombosis.  CURRENT THERAPY:  Lovenox 100 mg subcu daily.  INTERIM HISTORY:  Barbara Moses comes in for followup.  She is doing well on the Lovenox.  She is having no problems with the Lovenox.  She does not mind really giving herself injections.  We did a an extensive hypercoagulable workup on her.  All of her tests really came back negative.  We did go ahead and do a JAK2 analysis on her.  Her JAK2 mutation was negative.  We did do a CT scan on her.  This was done on 12/11/2011.  CT angiogram of the abdomen and pelvis did not show any evidence of residual thrombus in the right gonadal vein.  No abdominal pathology was noted.  In reality, all of her veins were patent.  She is still having some discomfort over in the left hip/thigh.  This may be more related to spinal issues than anything else.  There has been no bruising or bleeding.  She has had no problems with bowels or bladder.  Her baby is doing quite well.  She had a good Christmas.  PHYSICAL EXAM:  General:  This is a well-developed, well-nourished white female in no obvious distress.  Vital Signs:  Temperature of 97.1, pulse 82, respiratory rate 18, blood pressure 104/72.  Weight is 184. Head/Neck:  Exam shows a normocephalic, atraumatic skull.  There are no ocular or oral lesions.  No palpable cervical, or supraclavicular lymph nodes.  Lungs:  Clear bilaterally.  Cardiac:  Regular rate and rhythm with normal S1, S2.  There are no murmurs, rubs or bruits.  Abdomen: Soft with good bowel sounds.  There is no palpable abdominal mass. There is no fluid wave.  There is no palpable hepatosplenomegaly.  Back: No tenderness over the spine, ribs, or hips.  Extremities:  No clubbing, cyanosis or edema.  Skin:  No rashes, ecchymosis or petechiae.  LABORATORY STUDIES:  White cell count 5.8, hemoglobin 13.5,  hematocrit 38.8, platelet count 303.  When we last saw her in October, her D-dimer was 0.4.  IMPRESSION:  Barbara Moses is a 26 year old white female with right gonadal vein thrombosis.  One could presume this was related to her recent pregnancy.  We will go ahead and give her 6 months of Lovenox.  She will have this finished up in March.  She wants to continue Lovenox through March.  I will plan to see her back at that time.    ______________________________ Josph Macho, M.D. PRE/MEDQ  D:  12/17/2011  T:  12/17/2011  Job:  [id number]  ADDENDUM:  (+) Low Protein S activity (64%).

## 2011-12-17 NOTE — Progress Notes (Signed)
This office note has been dictated.

## 2011-12-19 LAB — HEPARIN ANTI-XA: Heparin LMW: 0.37 IU/mL

## 2011-12-22 LAB — PROTEIN C ACTIVITY: Protein C Activity: 137 % — ABNORMAL HIGH (ref 75–133)

## 2012-01-06 ENCOUNTER — Emergency Department (HOSPITAL_BASED_OUTPATIENT_CLINIC_OR_DEPARTMENT_OTHER)
Admission: EM | Admit: 2012-01-06 | Discharge: 2012-01-06 | Disposition: A | Discharge status: Home / Self Care | Payer: Medicaid Other | Attending: Emergency Medicine | Admitting: Emergency Medicine

## 2012-01-06 ENCOUNTER — Encounter (HOSPITAL_BASED_OUTPATIENT_CLINIC_OR_DEPARTMENT_OTHER): Payer: Self-pay | Admitting: *Deleted

## 2012-01-06 DIAGNOSIS — F17200 Nicotine dependence, unspecified, uncomplicated: Secondary | ICD-10-CM | POA: Insufficient documentation

## 2012-01-06 DIAGNOSIS — Z86718 Personal history of other venous thrombosis and embolism: Secondary | ICD-10-CM | POA: Insufficient documentation

## 2012-01-06 DIAGNOSIS — H109 Unspecified conjunctivitis: Secondary | ICD-10-CM

## 2012-01-06 MED ORDER — TOBRAMYCIN 0.3 % OP SOLN: 1.0000 [drp] | OPHTHALMIC | Status: AC

## 2012-01-06 NOTE — ED Provider Notes (Signed)
Medical screening examination/treatment/procedure(s) were performed by non-physician practitioner and as supervising physician I was immediately available for consultation/collaboration.   Shyasia Funches A. Daysi Boggan, MD 01/06/12 2338 

## 2012-01-06 NOTE — ED Provider Notes (Signed)
History     CSN: [id number]  Arrival date & time 01/06/12  2045   First MD Initiated Contact with Patient 01/06/12 2130      Chief Complaint  Patient presents with  . Conjunctivitis    (Consider location/radiation/quality/duration/timing/severity/associated sxs/prior treatment) Patient is a 26 y.o. female presenting with conjunctivitis. The history is provided by the patient. No language interpreter was used.  Conjunctivitis  The current episode started today. The onset was gradual. The problem occurs continuously. The problem is moderate. The symptoms are relieved by nothing. The symptoms are aggravated by nothing. Associated symptoms include eye discharge and eye redness. Pertinent negatives include no fever, no muscle aches and no URI. The eye pain is moderate. There is pain in the right eye. The eye pain is not associated with movement. The eyelid exhibits redness. She has been eating and drinking normally.    Past Medical History  Diagnosis Date  . Gallstones   . Cervix, short (affecting pregnancy) 03/28/2011  . Anemia   . DVT (deep vein thrombosis) in pregnancy 12/17/2011  . Family history of DVT 12/17/2011    Past Surgical History  Procedure Date  . Cholecystectomy   . Wisdom tooth extraction     Family History  Problem Relation Age of Onset  . Fibroids Mother   . Diabetes Father   . Crohn's disease Father   . Cancer Maternal Grandmother     History  Substance Use Topics  . Smoking status: Current Everyday Smoker -- 0.2 packs/day for 7 years    Types: Cigarettes  . Smokeless tobacco: Never Used  . Alcohol Use: No    OB History    Grav Para Term Preterm Abortions TAB SAB Ect Mult Living   2 2 1 1  0 0 0 0 0 2      Review of Systems  Constitutional: Negative for fever.  Eyes: Positive for discharge and redness.  All other systems reviewed and are negative.    Allergies  Review of patient's allergies indicates no known allergies.  Home Medications    Current Outpatient Rx  Name Route Sig Dispense Refill  . ACETAMINOPHEN-CODEINE #3 300-30 MG PO TABS Oral Take 1 tablet by mouth every 4 (four) hours as needed. For pain    . CLONAZEPAM 0.5 MG PO TABS Oral Take 0.5 mg by mouth 2 (two) times daily as needed. For anxiety    . ENOXAPARIN SODIUM 80 MG/0.8ML [place] SOLN Subcutaneous Inject 80 mg into the skin daily.      BP 102/75  Pulse 84  Temp(Src) 98.8 F (37.1 C) (Oral)  Resp 16  Ht 5\' 3"  (1.6 m)  Wt 152 lb (68.947 kg)  BMI 26.93 kg/m2  SpO2 99%  LMP 01/06/2012  Physical Exam  Nursing note and vitals reviewed. Constitutional: She is oriented to person, place, and time. She appears well-developed and well-nourished.  HENT:  Head: Normocephalic and atraumatic.  Right Ear: External ear normal.  Left Ear: External ear normal.  Nose: Nose normal.  Mouth/Throat: Oropharynx is clear and moist.  Eyes: Pupils are equal, round, and reactive to light. Right eye exhibits discharge.       Injected right conjunctiva  Neck: Normal range of motion. Neck supple.  Pulmonary/Chest: Effort normal.  Abdominal: Soft.  Neurological: She is alert and oriented to person, place, and time. She has normal reflexes.  Skin: Skin is warm.  Psychiatric: She has a normal mood and affect.    ED Course  Procedures (including critical care  time)  Labs Reviewed - No data to display No results found.   No diagnosis found.    MDM  Rx for tobrex        Langston Masker, [place] 01/06/12 2156

## 2012-01-06 NOTE — ED Notes (Addendum)
Right eye schlera redenned since this morning. Denies injury. +Itching.

## 2012-01-12 ENCOUNTER — Emergency Department (HOSPITAL_BASED_OUTPATIENT_CLINIC_OR_DEPARTMENT_OTHER)
Admission: EM | Admit: 2012-01-12 | Discharge: 2012-01-12 | Disposition: A | Discharge status: Home / Self Care | Payer: Medicaid Other | Attending: Emergency Medicine | Admitting: Emergency Medicine

## 2012-01-12 ENCOUNTER — Encounter (HOSPITAL_BASED_OUTPATIENT_CLINIC_OR_DEPARTMENT_OTHER): Payer: Self-pay | Admitting: Student

## 2012-01-12 DIAGNOSIS — H109 Unspecified conjunctivitis: Secondary | ICD-10-CM | POA: Insufficient documentation

## 2012-01-12 DIAGNOSIS — S058X9A Other injuries of unspecified eye and orbit, initial encounter: Secondary | ICD-10-CM | POA: Insufficient documentation

## 2012-01-12 DIAGNOSIS — S0501XA Injury of conjunctiva and corneal abrasion without foreign body, right eye, initial encounter: Secondary | ICD-10-CM

## 2012-01-12 DIAGNOSIS — H5710 Ocular pain, unspecified eye: Secondary | ICD-10-CM | POA: Insufficient documentation

## 2012-01-12 DIAGNOSIS — X58XXXA Exposure to other specified factors, initial encounter: Secondary | ICD-10-CM | POA: Insufficient documentation

## 2012-01-12 MED ORDER — FLUORESCEIN SODIUM 1 MG OP STRP
1.0000 | ORAL_STRIP | Freq: Once | OPHTHALMIC | Status: AC
  Filled 2012-01-12 (×2): qty 1

## 2012-01-12 MED ORDER — TETRACAINE HCL 0.5 % OP SOLN
1.0000 [drp] | Freq: Once | OPHTHALMIC | Status: AC
  Administered 2012-01-12: 1 [drp] via OPHTHALMIC
  Filled 2012-01-12: qty 2

## 2012-01-12 NOTE — ED Notes (Signed)
Continued pain and redness in right eye, s/p dx of "pink eye." reports sleeping in contacts and eye is now hurting more and not improving.

## 2012-01-12 NOTE — ED Provider Notes (Signed)
History     CSN: [id number]  Arrival date & time 01/12/12  0700   First MD Initiated Contact with Patient 01/12/12 0809      Chief Complaint  Patient presents with  . Conjunctivitis    (Consider location/radiation/quality/duration/timing/severity/associated sxs/prior treatment) Patient is a 26 y.o. female presenting with conjunctivitis. The history is provided by the patient.  Conjunctivitis  The current episode started 3 to 5 days ago. The onset was sudden. The problem occurs continuously. The problem has been unchanged. The problem is moderate. The symptoms are relieved by nothing. Exacerbated by: eye movment, closing/opening eye. Associated symptoms include eye discharge, eye pain and eye redness. Pertinent negatives include no fever, no decreased vision, no double vision, no eye itching, no photophobia, no congestion, no ear pain, no headaches, no sore throat, no neck pain and no rash. The eye pain is severe. There is pain in the right eye. The eye pain is associated with movement. The eyelid exhibits swelling. Recently, medical care has been given at this facility. Services received include medications given.  Pt with recent dx conjunctivitis. Had some improvement in symptoms until she put in her contact lens yesterday and fell asleep without removing it.  Past Medical History  Diagnosis Date  . Gallstones   . Cervix, short (affecting pregnancy) 03/28/2011  . Anemia   . DVT (deep vein thrombosis) in pregnancy 12/17/2011  . Family history of DVT 12/17/2011    Past Surgical History  Procedure Date  . Cholecystectomy   . Wisdom tooth extraction     Family History  Problem Relation Age of Onset  . Fibroids Mother   . Diabetes Father   . Crohn's disease Father   . Cancer Maternal Grandmother     History  Substance Use Topics  . Smoking status: Current Everyday Smoker -- 0.2 packs/day for 7 years    Types: Cigarettes  . Smokeless tobacco: Never Used  . Alcohol Use: No     OB History    Grav Para Term Preterm Abortions TAB SAB Ect Mult Living   2 2 1 1  0 0 0 0 0 2      Review of Systems  Constitutional: Negative for fever and chills.  HENT: Negative for ear pain, congestion, sore throat, neck pain and neck stiffness.   Eyes: Positive for pain, discharge and redness. Negative for double vision, photophobia and itching.  Skin: Negative for rash.  Neurological: Negative for dizziness, syncope and headaches.    Allergies  Review of patient's allergies indicates no known allergies.  Home Medications   Current Outpatient Rx  Name Route Sig Dispense Refill  . ACETAMINOPHEN-CODEINE #3 300-30 MG PO TABS Oral Take 1 tablet by mouth every 4 (four) hours as needed. For pain    . CLONAZEPAM 0.5 MG PO TABS Oral Take 0.5 mg by mouth 2 (two) times daily as needed. For anxiety    . ENOXAPARIN SODIUM 80 MG/0.8ML [place] SOLN Subcutaneous Inject 80 mg into the skin daily.    . TOBRAMYCIN SULFATE 0.3 % OP SOLN Right Eye Place 1 drop into the right eye every 4 (four) hours. 5 mL 0    BP 103/68  Pulse 78  Temp(Src) 99.3 F (37.4 C) (Oral)  Resp 20  Wt 152 lb (68.947 kg)  SpO2 99%  LMP 01/06/2012  Physical Exam  Nursing note and vitals reviewed. Constitutional: She is oriented to person, place, and time. She appears well-developed and well-nourished.       Uncomfortable appearing  HENT:  Head: Normocephalic and atraumatic.  Right Ear: External ear normal.  Left Ear: External ear normal.  Nose: Nose normal.  Mouth/Throat: Oropharynx is clear and moist.  Eyes: EOM are normal. Pupils are equal, round, and reactive to light. No foreign bodies found. Right eye exhibits discharge. No foreign body present in the right eye. No foreign body present in the left eye. Right conjunctiva is injected. No scleral icterus.  Slit lamp exam:      The right eye shows corneal abrasion. The right eye shows no foreign body.  Neck: Normal range of motion. Neck supple.   Cardiovascular: Normal rate and regular rhythm.   Pulmonary/Chest: No respiratory distress.  Musculoskeletal: She exhibits no edema and no tenderness.  Lymphadenopathy:    She has no cervical adenopathy.  Neurological: She is alert and oriented to person, place, and time. No cranial nerve deficit.  Skin: Skin is warm and dry. No rash noted.  Psychiatric: She has a normal mood and affect.    ED Course  Procedures (including critical care time)  Labs Reviewed - No data to display No results found.   1. Corneal abrasion, right   2. Conjunctivitis of right eye       MDM  Conjunctivitis with return of symptoms after contact use prior to complete resolution. Pt used same contact that was used prior to dx with conjunctivitis. Corneal abrasion lateral to iris of right eye. Advised no contact lens use until complete resolution of symptoms as well as ophthalmology f/u. Strongly advised disposal of contaminated contact. Pt is not sure where her glasses are- encouraged to get a new pair to use while she is using the abx eye drops. No new abx rx given as tobrex is sufficient for abrasion in contact lens wearers.        Shaaron Adler, [place] 01/12/12 [phone]

## 2012-01-12 NOTE — ED Provider Notes (Signed)
Medical screening examination/treatment/procedure(s) were performed by non-physician practitioner and as supervising physician I was immediately available for consultation/collaboration.   Forbes Cellar, MD 01/12/12 [phone]

## 2012-02-19 ENCOUNTER — Ambulatory Visit (HOSPITAL_BASED_OUTPATIENT_CLINIC_OR_DEPARTMENT_OTHER): Payer: Medicaid Other | Admitting: Hematology & Oncology

## 2012-02-19 ENCOUNTER — Other Ambulatory Visit (HOSPITAL_BASED_OUTPATIENT_CLINIC_OR_DEPARTMENT_OTHER): Payer: Medicaid Other | Admitting: Lab

## 2012-02-19 VITALS — BP 107/75 | HR 79 | Temp 98.9°F | Ht 63.0 in | Wt 146.0 lb

## 2012-02-19 DIAGNOSIS — D6859 Other primary thrombophilia: Secondary | ICD-10-CM

## 2012-02-19 DIAGNOSIS — F17200 Nicotine dependence, unspecified, uncomplicated: Secondary | ICD-10-CM

## 2012-02-19 DIAGNOSIS — Z8249 Family history of ischemic heart disease and other diseases of the circulatory system: Secondary | ICD-10-CM

## 2012-02-19 DIAGNOSIS — Z86718 Personal history of other venous thrombosis and embolism: Secondary | ICD-10-CM

## 2012-02-19 DIAGNOSIS — O2230 Deep phlebothrombosis in pregnancy, unspecified trimester: Secondary | ICD-10-CM

## 2012-02-19 DIAGNOSIS — E039 Hypothyroidism, unspecified: Secondary | ICD-10-CM

## 2012-02-19 LAB — CBC WITH DIFFERENTIAL (CANCER CENTER ONLY)
BASO#: 0 10*3/uL (ref 0.0–0.2)
EOS%: 9.7 % — ABNORMAL HIGH (ref 0.0–7.0)
HCT: 38 % (ref 34.8–46.6)
HGB: 13.2 g/dL (ref 11.6–15.9)
LYMPH#: 2.9 10*3/uL (ref 0.9–3.3)
MCH: 30.5 pg (ref 26.0–34.0)
MCHC: 34.7 g/dL (ref 32.0–36.0)
NEUT%: 44.4 % (ref 39.6–80.0)

## 2012-02-19 NOTE — Progress Notes (Signed)
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  CURRENT THERAPY:  The patient to start an aspirin 160 mg p.o. q. day.  INTERIM HISTORY:  Barbara Moses comes in for followup.  She has not been able to take her Lovenox for the past couple of weeks or so.  I think it has been pretty tough for her at home.  As such, I believe that we can just go ahead and put her on aspirin now.  We did find that she does have a protein S deficiency.  We will rechecked her protein S levels.  She was off Coumadin for several months.  Her protein S activity was only 164%.  That, in relation to the fact that there is a family history of DVT, I think is indicative of a true protein S deficiency.  When we last checked her CT scan in January, there was no evidence of residual gonadal vein thrombus.  She was also found to have hypothyroidism.  She now is on Synthroid.  She has had no problems with bleeding.  She is having her monthly cycles.  They are not heavy.  She has had no cough.  She is still smoking and is trying to cut back.  PHYSICAL EXAM:  General: This is a well-developed, well-nourished, white female in no obvious distress.  Vital signs: 98.9, pulse 79, respiratory 18, blood pressure is 107/75, and weight is 146.  Head and neck exam shows a normocephalic, atraumatic skull.  There are no ocular or oral lesions.  There are no palpable cervical or supraclavicular lymph nodes. Lungs are clear bilaterally.  Cardiac examination:  Regular rhythm with a normal S1 and S2.  There are no murmurs, rubs, or bruits.  Abdominal exam: Soft with good bowel sounds.  There is no palpable abdominal mass. There is no fluid wave.  There is no palpable hepatosplenomegaly. Extremities: Shows no clubbing, cyanosis, or edema.  Neurological exam: Shows no focal neurological deficits.  LABORATORY STUDIES:  White cell count is 7.5, hemoglobin 13.2, hematocrit 38, and  platelet count is 295.  IMPRESSION:  Barbara Moses is a 26 year old white female with protein S deficiency.  I suspect that she probably got this from her father.  She basically finished her 6 months of anticoagulation.  She was on Lovenox.  Again, we put on low-dose aspirin as this has clearly been shown to help decrease the risk of recurrence.  Given the fact that she does smoke, I think aspirin would not be a bad idea for her.  For now, I think we can let Barbara Moses go from the clinic.  We really do not need to monitor her at this point.  I told her that if she does get pregnant in the future, that she really needs to let us know that she will need to go back on a blood thinner at that point in time.  It certainly was a pleasure to have take care of Barbara Moses.  She is awful nice.  I just know that she will do well in the future.    ______________________________ Josph Macho, M.D. PRE/MEDQ  D:  02/19/2012  T:  02/19/2012  Job:  [id number]

## 2012-02-19 NOTE — Progress Notes (Signed)
This office note has been dictated.

## 2012-02-23 LAB — PROTEIN C, TOTAL AND FUNCTIONAL PANEL: Protein C, Total: 77 % (ref 72–160)

## 2012-02-23 LAB — PROTEIN S, ANTIGEN, FREE: Protein S Ag, Free: 68 % normal (ref 50–147)

## 2012-02-23 LAB — PROTEIN S, TOTAL AND FUNCTIONAL PANEL: Protein S Activity: 55 % — ABNORMAL LOW (ref 69–129)

## 2012-05-12 ENCOUNTER — Encounter (HOSPITAL_BASED_OUTPATIENT_CLINIC_OR_DEPARTMENT_OTHER): Payer: Self-pay | Admitting: Emergency Medicine

## 2012-05-12 ENCOUNTER — Emergency Department (HOSPITAL_BASED_OUTPATIENT_CLINIC_OR_DEPARTMENT_OTHER)
Admission: EM | Admit: 2012-05-12 | Discharge: 2012-05-12 | Disposition: A | Discharge status: Home / Self Care | Payer: Medicaid Other | Attending: Emergency Medicine | Admitting: Emergency Medicine

## 2012-05-12 ENCOUNTER — Emergency Department (HOSPITAL_BASED_OUTPATIENT_CLINIC_OR_DEPARTMENT_OTHER): Payer: Medicaid Other

## 2012-05-12 DIAGNOSIS — S6000XA Contusion of unspecified finger without damage to nail, initial encounter: Secondary | ICD-10-CM | POA: Insufficient documentation

## 2012-05-12 HISTORY — DX: Disorder of thyroid, unspecified: E07.9

## 2012-05-12 MED ORDER — IBUPROFEN 800 MG PO TABS
800.0000 mg | ORAL_TABLET | Freq: Once | ORAL | Status: AC
  Administered 2012-05-12: 800 mg via ORAL
  Filled 2012-05-12: qty 1

## 2012-05-12 MED ORDER — HYDROCODONE-ACETAMINOPHEN 5-325 MG PO TABS: 1.0000 | ORAL_TABLET | ORAL | Status: AC | PRN

## 2012-05-12 NOTE — Discharge Instructions (Signed)
Contusion (Bruise) of Hand  An injury to the hand may cause bruises (contusions). Contusions are caused by bleeding from small blood vessels (capillaries) that allow blood to leak out into the muscles, tendons, and surrounding soft tissue. This is followed by swelling and pain (inflammation). Contusions of the hand are common because of the use of hands in daily and recreational activities. Signs of a hand injury include pain, swelling, and a color change. Initially the skin may turn blue to purple in color. As the bruise ages, the color turns yellow and orange. Swelling may decrease the movement of the fingers. Contusions are seen more commonly with:   Contact sports (especially in football, wrestling, and basketball).   Use of medications that thin the blood (anticoagulants).   Use of aspirin and nonsteroidal anti-inflammatory agents that decrease the ability of the blood to clot.   Vitamin deficiencies.   Aging.  DIAGNOSIS   Diagnosis of hand injuries can be made by your own observation. If problems continue, a caregiver may be required for further evaluation and treatment. X-rays may be required to make sure there are no broken bones (fractures). Continued problems may require physical therapy for treatment.  RISKS AND COMPLICATIONS   Extensive bleeding and tissue inflammation. This can lead to disability and arthritis-type problems later on if the hand does not heal properly.   Infection of the hand if there are breaks in the skin. This is especially true if the hand injury came from someone's teeth, such as would occur with punching someone in the mouth. This can lead to an infection of the tendons and the membranes surrounding the tendons (sheaths). This infection can have severe complications including a loss of function (a "frozen" hand).   Rupture of the tendons requiring a surgical repair. Failure to repair the tendons can result in loss of function of the hand or fingers.  HOME CARE INSTRUCTIONS     Apply ice to the injury for 15 to 20 minutes, 3 to 4 times per day. Put the ice in a plastic bag and place a towel between the bag of ice and your skin.   An elastic bandage may be used initially for support and to minimize swelling. Do not wrap the hand too tightly. Do not sleep with the elastic bandage on.   Gentle massage from the fingertips towards the elbow will help keep the swelling down. Gently open and close your fist while doing this to maintain range of motion. Do this only after the first few days, when there is no or minimal pain.   Keep your hand above the level of the heart when swelling and pain are present. This will allow the fluid to drain out of the hand, decreasing the amount of swelling. This will improve healing time.   Try to avoid use of the injured hand (except for gentle range of motion) while the hand is hurting. Do not resume use until instructed by your caregiver. Then begin use gradually, do not increase use to the point of pain. If pain does develop, decrease use and continue the above measures, gradually increasing activities that do not cause discomfort until you achieve normal use.   Only take over-the-counter or prescription medicines for pain, discomfort, or fever as directed by your caregiver.   Follow up with your caregiver as directed. Follow-up care may include orthopedic referrals, physical therapy, and rehabilitation. Any delay in obtaining necessary care could result in delayed healing, or temporary or permanent disability.  REHABILITATION     Begin daily rehabilitation exercises when an elastic bandage is no longer needed and you are either pain free or only have minimal pain.   Use ice massage for 10 minutes before and after workouts. Put ice in a plastic bag and place a towel between the bag of ice and your skin. Massage the injured area with the ice pack.  SEEK IMMEDIATE MEDICAL CARE IF:    Your pain and swelling increase, or pain is uncontrolled with  medications.   You have loss of feeling in your hand, or your hand turns cold or blue.   An oral temperature above 102 F (38.9 C) develops, not controlled by medication.   Your hand becomes warm to the touch, or you have increased pain with even slight movement of your fingers.   Your hand does not begin to improve in 1 or 2 days.   The skin is broken and signs of infection occur (fluid draining from the contusion, increasing pain, fever, headache, muscle aches, dizziness, or a general ill feeling).   You develop new, unexplained problems, or an increase of the symptoms that brought you to your caregiver.  MAKE SURE YOU:    Understand these instructions.   Will watch your condition.   Will get help right away if you are not doing well or get worse.  Document Released: 05/16/2002 Document Revised: 11/13/2011 Document Reviewed: 05/03/2010  [employer] Patient Information 2012 [employer].

## 2012-05-12 NOTE — ED Notes (Signed)
Slammed right 2nd finger in car door.

## 2012-05-12 NOTE — ED Notes (Signed)
Patient transported to X-ray 

## 2012-05-12 NOTE — ED Provider Notes (Signed)
History     CSN: [id number]  Arrival date & time 05/12/12  2046   First MD Initiated Contact with Patient 05/12/12 2113      Chief Complaint  Patient presents with  . Finger Injury    (Consider location/radiation/quality/duration/timing/severity/associated sxs/prior treatment) HPI Comments: Patient accidentally slammed her right index finger into a car door.  This is her only area of injury.  She notes that she can flex and extend at the MCP, PIP and DIP joint does have pain.  There is noted bruising on the finger and bruising underneath her fingernail.  She is right-hand dominant.  She did take extra Tylenol after this occurred.  It occurred today at approximately 4 PM.  The history is provided by the patient.    Past Medical History  Diagnosis Date  . Gallstones   . Cervix, short (affecting pregnancy) 03/28/2011  . Anemia   . DVT (deep vein thrombosis) in pregnancy 12/17/2011  . Family history of DVT 12/17/2011  . Thyroid condition     Past Surgical History  Procedure Date  . Cholecystectomy   . Wisdom tooth extraction     Family History  Problem Relation Age of Onset  . Fibroids Mother   . Diabetes Father   . Crohn's disease Father   . Cancer Maternal Grandmother     History  Substance Use Topics  . Smoking status: Current Everyday Smoker -- 0.5 packs/day for 7 years    Types: Cigarettes  . Smokeless tobacco: Never Used  . Alcohol Use: Yes    OB History    Grav Para Term Preterm Abortions TAB SAB Ect Mult Living   2 2 1 1  0 0 0 0 0 2      Review of Systems  Constitutional: Negative.  Negative for fever and chills.  HENT: Negative.   Eyes: Negative.   Respiratory: Negative.  Negative for shortness of breath.   Cardiovascular: Negative.   Gastrointestinal: Negative.  Negative for nausea, vomiting and abdominal pain.  Genitourinary: Negative.   Musculoskeletal: Negative.  Negative for back pain.  Skin: Positive for color change. Negative for rash.    Neurological: Negative.  Negative for syncope and headaches.  Hematological: Negative.  Negative for adenopathy.  Psychiatric/Behavioral: Negative.  Negative for confusion.  All other systems reviewed and are negative.    Allergies  Review of patient's allergies indicates no known allergies.  Home Medications   Current Outpatient Rx  Name Route Sig Dispense Refill  . ACETAMINOPHEN 500 MG PO TABS Oral Take 1,000 mg by mouth every 6 (six) hours as needed. Patient used this medication for her finger pain.    Marland Kitchen LEVOTHYROXINE SODIUM 100 MCG PO TABS Oral Take 100 mcg by mouth daily.    Marland Kitchen ENOXAPARIN SODIUM 150 MG/ML [place] SOLN Subcutaneous Inject 0.69 mLs (105 mg total) into the skin daily. 25 mL 1    BP 110/54  Pulse 82  Temp(Src) 99 F (37.2 C) (Oral)  Ht 5\' 2"  (1.575 m)  Wt 135 lb (61.236 kg)  BMI 24.69 kg/m2  SpO2 98%  LMP 04/14/2012  Breastfeeding? No  Physical Exam  Nursing note and vitals reviewed. Constitutional: She is oriented to person, place, and time. She appears well-developed and well-nourished.  Non-toxic appearance. She does not have a sickly appearance.  HENT:  Head: Normocephalic and atraumatic.  Eyes: Conjunctivae, EOM and lids are normal. Pupils are equal, round, and reactive to light. No scleral icterus.  Neck: Trachea normal and normal range of  motion. Neck supple.  Cardiovascular: Normal rate.   Pulmonary/Chest: Effort normal.  Abdominal: Normal appearance. There is no CVA tenderness.  Musculoskeletal: Normal range of motion.       Evaluation of right index finger demonstrates a mild amount of bruising at the distal phalanx on the palmar side.  There is a subungual hematoma under the nail that is less than 50% of the nail.  Patient demonstrates that she can flex at the MCP, PIP and DIP joints.  She can fully extend the digit as well.  There is tenderness to palpation down the length of the finger.  No tenderness over her other digits are over her metacarpals.   She has a palpable radial pulse.  Capillary refills less than 2 seconds.  Neurological: She is alert and oriented to person, place, and time. She has normal strength.  Skin: Skin is warm, dry and intact. No rash noted.  Psychiatric: She has a normal mood and affect. Her behavior is normal. Judgment and thought content normal.    ED Course  Procedures (including critical care time)  Labs Reviewed - No data to display Dg Finger Index Right  05/12/2012  *RADIOLOGY REPORT*  Clinical Data: Injured index finger.  RIGHT INDEX FINGER 2+V  Comparison: None  Findings: The joint spaces are maintained.  No acute fracture.  IMPRESSION: No acute bony findings.  Original Report Authenticated By: P. Loralie Champagne, M.D.     No diagnosis found.    MDM  Patient with no acute fractures.  She has significant contusion to her finger with a subungual hematoma that is less than 50% of the nail.  Patient will be given a prescription for pain medicine for home has been given precautions that if it significantly worsens or her pain significantly worsens that she should return for further evaluation or followup with the hand specialist.        Nat Christen, MD 05/12/12 2141

## 2012-06-30 ENCOUNTER — Encounter (HOSPITAL_COMMUNITY): Payer: Self-pay | Admitting: Emergency Medicine

## 2012-06-30 ENCOUNTER — Emergency Department (HOSPITAL_COMMUNITY)
Admission: EM | Admit: 2012-06-30 | Discharge: 2012-06-30 | Disposition: A | Discharge status: Home / Self Care | Payer: Medicaid Other | Source: Home / Self Care | Attending: Emergency Medicine | Admitting: Emergency Medicine

## 2012-06-30 DIAGNOSIS — IMO0001 Reserved for inherently not codable concepts without codable children: Secondary | ICD-10-CM

## 2012-06-30 DIAGNOSIS — G43909 Migraine, unspecified, not intractable, without status migrainosus: Secondary | ICD-10-CM

## 2012-06-30 DIAGNOSIS — R209 Unspecified disturbances of skin sensation: Secondary | ICD-10-CM

## 2012-06-30 MED ORDER — KETOROLAC TROMETHAMINE 10 MG PO TABS: 10.0000 mg | ORAL_TABLET | Freq: Four times a day (QID) | ORAL | Status: AC | PRN

## 2012-06-30 NOTE — ED Notes (Addendum)
HERE WITH C/O FRONTAL MIGRAINE H/A UNRELIEVED BY RX TOPAMAX AND IMITREX.STATES IMITREX CAUSING DROWSINESS.WORSENS WITH STANDING.DENIES N/V.ALSO REPORTS OF ACHY PAINS ALL OVER WITH NUMBNESS SENSATION INTERMIT.SX STARTED ONGOING YEARS

## 2012-06-30 NOTE — ED Provider Notes (Addendum)
History     CSN: [id number]  Arrival date & time 06/30/12  1443   First MD Initiated Contact with Patient 06/30/12 1503      Chief Complaint  Patient presents with  . Migraine  . Generalized Body Aches    (Consider location/radiation/quality/duration/timing/severity/associated sxs/prior treatment) HPI Comments: My Dr. have started me on this Topamax medicine that is only making me feel drowsy and is not helping with my migraine headaches. I have had a headache for more than 3 days, feel pain and numbness and tingling sensations on both my hands and feet, not all the time they seem to come and go. No fevers, no nausea no vomiting. Patient describes it she has had headaches for a long time and dusting seems to work. He has been taking Tylenol and Motrin for her headache but doesn't seem to help her. Patient denies any visual disturbances, weakness of any upper or lower extremities.  Patient is a 26 y.o. female presenting with migraine. The history is provided by the patient.  Migraine This is a new problem. The current episode started more than 2 days ago. The problem occurs constantly. The problem has not changed since onset.Pertinent negatives include no abdominal pain. Nothing relieves the symptoms.    Past Medical History  Diagnosis Date  . Gallstones   . Cervix, short (affecting pregnancy) 03/28/2011  . Anemia   . DVT (deep vein thrombosis) in pregnancy 12/17/2011  . Family history of DVT 12/17/2011  . Thyroid condition     Past Surgical History  Procedure Date  . Cholecystectomy   . Wisdom tooth extraction     Family History  Problem Relation Age of Onset  . Fibroids Mother   . Diabetes Father   . Crohn's disease Father   . Cancer Maternal Grandmother     History  Substance Use Topics  . Smoking status: Current Everyday Smoker -- 0.5 packs/day for 7 years    Types: Cigarettes  . Smokeless tobacco: Never Used  . Alcohol Use: Yes    OB History    Grav Para Term  Preterm Abortions TAB SAB Ect Mult Living   2 2 1 1  0 0 0 0 0 2      Review of Systems  Constitutional: Negative for fever, chills, activity change, appetite change and fatigue.  HENT: Negative for hearing loss and ear pain.   Gastrointestinal: Negative for abdominal pain.  Neurological: Positive for light-headedness and numbness. Negative for dizziness, tremors, seizures, syncope, speech difficulty and weakness.    Allergies  Review of patient's allergies indicates no known allergies.  Home Medications   Current Outpatient Rx  Name Route Sig Dispense Refill  . CLONAZEPAM 2 MG PO TABS Oral Take 2 mg by mouth 2 (two) times daily as needed.    . TOPIRAMATE 25 MG PO CPSP Oral Take 25 mg by mouth 2 (two) times daily.    . ACETAMINOPHEN 500 MG PO TABS Oral Take 1,000 mg by mouth every 6 (six) hours as needed. Patient used this medication for her finger pain.    Marland Kitchen ENOXAPARIN SODIUM 150 MG/ML [place] SOLN Subcutaneous Inject 0.69 mLs (105 mg total) into the skin daily. 25 mL 1  . KETOROLAC TROMETHAMINE 10 MG PO TABS Oral Take 1 tablet (10 mg total) by mouth every 6 (six) hours as needed for pain. 20 tablet 0  . LEVOTHYROXINE SODIUM 100 MCG PO TABS Oral Take 100 mcg by mouth daily.      BP 111/68  Pulse 85  Temp 98.5 F (36.9 C) (Oral)  Resp 16  SpO2 99%  LMP 06/12/2012  Physical Exam  Nursing note and vitals reviewed. Constitutional: She is oriented to person, place, and time. She appears well-developed and well-nourished.  Non-toxic appearance. She does not have a sickly appearance. She does not appear ill. No distress.    Neck: Neck supple.  Cardiovascular: Normal rate.   Abdominal: Soft.  Musculoskeletal: She exhibits tenderness.       Right shoulder: She exhibits tenderness and pain. She exhibits normal range of motion, no swelling, no effusion, no deformity, no spasm, normal pulse and normal strength.  Neurological: She is alert and oriented to person, place, and time. No  cranial nerve deficit or sensory deficit. She exhibits normal muscle tone. Coordination and gait normal. GCS eye subscore is 4. GCS verbal subscore is 5. GCS motor subscore is 6.       No sensorial deficits noted with tactile and proprioception. Muscle tone and strength on both upper and lower extremities are within normal. Significant for exam is a global somewhat nonfocal or specific tenderness on both her upper arms and hands and feet.  Skin: Skin is warm. No rash noted. No erythema.    ED Course  Procedures (including critical care time)  Labs Reviewed - No data to display No results found.   1. Migraine   2. Paresthesias/numbness       MDM    Recurrent migraine headaches, patient sees a Dr. Algis Downs, and had been started her recently on preventive and rescue migraine medicines. Patient describes that this medicines are just making her feel drowsy and her not helping with her headaches. Her neurological physical exam was unremarkable as patient has no sensorial or motor dysfunction. She has been prescribed Toradol to try every 8 hours for the next 48 hours. Patient was advised to return at Dr. primary care Dr. as given complexities of her migraine headaches she could benefit from a neurological consult. She agreed with this treatment plan and will also as will followup with her primary care doctor.  I have described to patient what symptoms should warrant further evaluation in the emergency department, she agrees and understands that if worsening symptoms or new symptoms she would immediately go to the emergency department for further evaluation. I believe patient was be experiencing a complex-type migraine versus a cerebrovascular accident or a secondary reason or etiology for her headaches. She seemed to be expressing this headaches for a long time    Jimmie Molly, MD 06/30/12 1937  Jimmie Molly, MD 06/30/12 [phone]

## 2012-07-23 ENCOUNTER — Emergency Department (HOSPITAL_BASED_OUTPATIENT_CLINIC_OR_DEPARTMENT_OTHER)
Admission: EM | Admit: 2012-07-23 | Discharge: 2012-07-23 | Disposition: A | Discharge status: Home / Self Care | Payer: Medicaid Other | Attending: Emergency Medicine | Admitting: Emergency Medicine

## 2012-07-23 ENCOUNTER — Encounter (HOSPITAL_BASED_OUTPATIENT_CLINIC_OR_DEPARTMENT_OTHER): Payer: Self-pay | Admitting: *Deleted

## 2012-07-23 DIAGNOSIS — D649 Anemia, unspecified: Secondary | ICD-10-CM | POA: Insufficient documentation

## 2012-07-23 DIAGNOSIS — F17200 Nicotine dependence, unspecified, uncomplicated: Secondary | ICD-10-CM | POA: Insufficient documentation

## 2012-07-23 DIAGNOSIS — Z86718 Personal history of other venous thrombosis and embolism: Secondary | ICD-10-CM | POA: Insufficient documentation

## 2012-07-23 DIAGNOSIS — M542 Cervicalgia: Secondary | ICD-10-CM | POA: Insufficient documentation

## 2012-07-23 MED ORDER — HYDROCODONE-ACETAMINOPHEN 5-500 MG PO TABS: 1.0000 | ORAL_TABLET | Freq: Four times a day (QID) | ORAL | Status: AC | PRN

## 2012-07-23 MED ORDER — PREDNISONE 10 MG PO TABS: 20.0000 mg | ORAL_TABLET | Freq: Two times a day (BID) | ORAL | Status: DC

## 2012-07-23 NOTE — ED Notes (Addendum)
Pinched nerve in neck a year ago. Today her neck and shoulder has been more painful than usual. No relief tramadol, tylenol and ibuprofen. Drove herself here.

## 2012-07-23 NOTE — ED Provider Notes (Signed)
History     CSN: [id number]  Arrival date & time 07/23/12  4098   First MD Initiated Contact with Patient 07/23/12 1917      Chief Complaint  Patient presents with  . Neck Pain    (Consider location/radiation/quality/duration/timing/severity/associated sxs/prior treatment) HPI Comments: Patient states neck pain for one year, got worse several days ago without injury or trauma.  The pain is in the right side of her neck and is worse with movement, turning head.  No relief with tramadol.  Patient is a 26 y.o. female presenting with neck pain. The history is provided by the patient.  Neck Pain  This is a new problem. The problem occurs constantly. The problem has been gradually worsening. The pain is associated with nothing. There has been no fever. The pain is present in the right side. The quality of the pain is described as burning. The pain radiates to the right shoulder. The pain is moderate. The symptoms are aggravated by twisting. Pertinent negatives include no numbness, no tingling and no weakness.    Past Medical History  Diagnosis Date  . Gallstones   . Cervix, short (affecting pregnancy) 03/28/2011  . Anemia   . DVT (deep vein thrombosis) in pregnancy 12/17/2011  . Family history of DVT 12/17/2011  . Thyroid condition     Past Surgical History  Procedure Date  . Cholecystectomy   . Wisdom tooth extraction     Family History  Problem Relation Age of Onset  . Fibroids Mother   . Diabetes Father   . Crohn's disease Father   . Cancer Maternal Grandmother     History  Substance Use Topics  . Smoking status: Current Everyday Smoker -- 0.5 packs/day for 7 years    Types: Cigarettes  . Smokeless tobacco: Never Used  . Alcohol Use: Yes    OB History    Grav Para Term Preterm Abortions TAB SAB Ect Mult Living   2 2 1 1  0 0 0 0 0 2      Review of Systems  HENT: Positive for neck pain.   Neurological: Negative for tingling, weakness and numbness.  All other  systems reviewed and are negative.    Allergies  Review of patient's allergies indicates no known allergies.  Home Medications   Current Outpatient Rx  Name Route Sig Dispense Refill  . ACETAMINOPHEN 500 MG PO TABS Oral Take 1,500 mg by mouth every 6 (six) hours as needed. Patient used this medication for her finger pain.    Marland Kitchen KETOROLAC TROMETHAMINE 10 MG PO TABS Oral Take 10 mg by mouth every 6 (six) hours as needed. For pain.    Marland Kitchen LEVOTHYROXINE SODIUM 100 MCG PO TABS Oral Take 100 mcg by mouth daily.    Marland Kitchen CLONAZEPAM 2 MG PO TABS Oral Take 2 mg by mouth 2 (two) times daily as needed. For anxiety.    . ENOXAPARIN SODIUM 150 MG/ML [place] SOLN Subcutaneous Inject 0.69 mLs (105 mg total) into the skin daily. 25 mL 1  . TOPIRAMATE 25 MG PO CPSP Oral Take 25 mg by mouth 2 (two) times daily.      BP 109/65  Pulse 82  Temp 97.9 F (36.6 C) (Oral)  Resp 20  SpO2 100%  LMP 06/12/2012  Physical Exam  Nursing note and vitals reviewed. Constitutional: She is oriented to person, place, and time. She appears well-developed and well-nourished. No distress.  HENT:  Head: Normocephalic and atraumatic.  Mouth/Throat: Oropharynx is clear and moist.  Neck:       Turning head to the right limited due to pain.    Musculoskeletal:       There is ttp over the right side of the neck.    The right shoulder appears grossly normal.  Full range of motion without pain.  Neurological: She is alert and oriented to person, place, and time. She exhibits normal muscle tone. Coordination normal.       Strength is 5/5 in bue.    Skin: Skin is warm and dry. She is not diaphoretic.    ED Course  Procedures (including critical care time)  Labs Reviewed - No data to display No results found.   No diagnosis found.    MDM  Will treat with prednisone, lortab.  Needs to see pcp to discuss further testing if not improving.  Symptoms present for one year and neuro exam intact, no indication for emergent  mri.        Geoffery Lyons, MD 07/23/12 [phone]

## 2012-08-04 ENCOUNTER — Emergency Department (HOSPITAL_BASED_OUTPATIENT_CLINIC_OR_DEPARTMENT_OTHER)
Admission: EM | Admit: 2012-08-04 | Discharge: 2012-08-04 | Disposition: A | Discharge status: Home / Self Care | Payer: Medicaid Other | Attending: Emergency Medicine | Admitting: Emergency Medicine

## 2012-08-04 ENCOUNTER — Encounter (HOSPITAL_BASED_OUTPATIENT_CLINIC_OR_DEPARTMENT_OTHER): Payer: Self-pay | Admitting: Student

## 2012-08-04 DIAGNOSIS — Z833 Family history of diabetes mellitus: Secondary | ICD-10-CM | POA: Insufficient documentation

## 2012-08-04 DIAGNOSIS — Z8489 Family history of other specified conditions: Secondary | ICD-10-CM | POA: Insufficient documentation

## 2012-08-04 DIAGNOSIS — Z809 Family history of malignant neoplasm, unspecified: Secondary | ICD-10-CM | POA: Insufficient documentation

## 2012-08-04 DIAGNOSIS — Z760 Encounter for issue of repeat prescription: Secondary | ICD-10-CM | POA: Insufficient documentation

## 2012-08-04 DIAGNOSIS — M542 Cervicalgia: Secondary | ICD-10-CM

## 2012-08-04 DIAGNOSIS — F17200 Nicotine dependence, unspecified, uncomplicated: Secondary | ICD-10-CM | POA: Insufficient documentation

## 2012-08-04 MED ORDER — HYDROCODONE-ACETAMINOPHEN 5-325 MG PO TABS: 1.0000 | ORAL_TABLET | Freq: Three times a day (TID) | ORAL | Status: AC | PRN

## 2012-08-04 MED ORDER — IBUPROFEN 600 MG PO TABS: 600.0000 mg | ORAL_TABLET | Freq: Three times a day (TID) | ORAL | Status: AC

## 2012-08-04 MED ORDER — LIDOCAINE HCL (PF) 1 % IJ SOLN
INTRAMUSCULAR | Status: AC
  Filled 2012-08-04: qty 5

## 2012-08-04 MED ORDER — LEVOTHYROXINE SODIUM 100 MCG PO TABS: 100.0000 ug | ORAL_TABLET | Freq: Every day | ORAL | Status: DC

## 2012-08-04 NOTE — ED Provider Notes (Signed)
History     CSN: [id number]  Arrival date & time 08/04/12  0909   First MD Initiated Contact with Patient 08/04/12 [phone]      Chief Complaint  Patient presents with  . Medication Refill    (Consider location/radiation/quality/duration/timing/severity/associated sxs/prior treatment) HPI The patient presents with ongoing neck pain and request for assistance with thyroid medication. Notably, the patient was seen here within the past 2 weeks evaluated for the neck pain.  She is since the event she said a mild temporary improvement in in her pain, but the pain is becoming more severe over the past days.  The pain is sore, worse with any motion of her head, most prominent in the right paraspinal region with radiation inferiorly.  She denies any new distal upper extremity dysesthesia or weakness.  Chest denies any loss of consciousness, confusion, disorientation, visual changes. The patient has a primary care physician in one week.  She notes that she ran out of her thyroid medication and also like a refill of this. Past Medical History  Diagnosis Date  . Gallstones   . Cervix, short (affecting pregnancy) 03/28/2011  . Anemia   . DVT (deep vein thrombosis) in pregnancy 12/17/2011  . Family history of DVT 12/17/2011  . Thyroid condition     Past Surgical History  Procedure Date  . Cholecystectomy   . Wisdom tooth extraction     Family History  Problem Relation Age of Onset  . Fibroids Mother   . Diabetes Father   . Crohn's disease Father   . Cancer Maternal Grandmother     History  Substance Use Topics  . Smoking status: Current Everyday Smoker -- 0.5 packs/day for 7 years    Types: Cigarettes  . Smokeless tobacco: Never Used  . Alcohol Use: Yes    OB History    Grav Para Term Preterm Abortions TAB SAB Ect Mult Living   2 2 1 1  0 0 0 0 0 2      Review of Systems  Constitutional:       HPI  HENT:       HPI otherwise negative  Eyes: Negative.   Respiratory:       HPI,  otherwise negative  Cardiovascular:       HPI, otherwise nmegative  Gastrointestinal: Negative for vomiting.  Genitourinary:       HPI, otherwise negative  Musculoskeletal:       HPI, otherwise negative  Skin: Negative.   Neurological: Negative for syncope.    Allergies  Review of patient's allergies indicates no known allergies.  Home Medications   Current Outpatient Rx  Name Route Sig Dispense Refill  . CLONAZEPAM 2 MG PO TABS Oral Take 2 mg by mouth 2 (two) times daily as needed. For anxiety.    . ENOXAPARIN SODIUM 150 MG/ML [place] SOLN Subcutaneous Inject 0.69 mLs (105 mg total) into the skin daily. 25 mL 1  . HYDROCODONE-ACETAMINOPHEN 5-325 MG PO TABS Oral Take 1 tablet by mouth every 8 (eight) hours as needed for pain. 12 tablet 0  . IBUPROFEN 600 MG PO TABS Oral Take 1 tablet (600 mg total) by mouth 3 (three) times daily. 12 tablet 0  . KETOROLAC TROMETHAMINE 10 MG PO TABS Oral Take 10 mg by mouth every 6 (six) hours as needed. For pain.    Marland Kitchen LEVOTHYROXINE SODIUM 100 MCG PO TABS Oral Take 1 tablet (100 mcg total) by mouth daily. 30 tablet 0  . TOPIRAMATE 25 MG PO CPSP  Oral Take 25 mg by mouth 2 (two) times daily.      BP 121/78  Pulse 72  Temp 98.2 F (36.8 C) (Oral)  Resp 18  Wt 132 lb (59.875 kg)  SpO2 100%  LMP 07/28/2012  Breastfeeding? No  Physical Exam  Nursing note and vitals reviewed. Constitutional: She is oriented to person, place, and time. She appears well-developed and well-nourished. No distress.  HENT:  Head: Normocephalic and atraumatic.  Mouth/Throat: Oropharynx is clear and moist.  Neck:       Turning head to the right limited due to pain.    Musculoskeletal:       There is ttp over the right side of the neck.    The right shoulder appears grossly normal.  Full range of motion without pain.  Neurological: She is alert and oriented to person, place, and time. She exhibits normal muscle tone. Coordination normal.       Strength is 5/5 in bue.      Skin: Skin is warm and dry. She is not diaphoretic.    ED Course  Procedures (including critical care time)  Labs Reviewed - No data to display No results found.   1. Neck pain       MDM  This young female presents with ongoing neck pain.  The patient initially requests another dosing of steroids, but given her ongoing evaluation for thyroid dysfunction, her recent steroid course, this was deferred.  The patient is in no distress, with no notable new complaints, but given her increasing pain, she received new analgesics, was counseled on the need for anti-inflammatories and cryotherapy, and discharged with PMD followup in one week.    Gerhard Munch, MD 08/04/12 1043

## 2012-08-04 NOTE — ED Notes (Signed)
Pt in with request for refill with synthroid medication until she has appointment with PCP on Tuesday. Pt also with continued complaint of neck pain and spasm and request refill of prior meds given while in er. Moves all extremities well.

## 2012-09-05 ENCOUNTER — Encounter (HOSPITAL_BASED_OUTPATIENT_CLINIC_OR_DEPARTMENT_OTHER): Payer: Self-pay | Admitting: *Deleted

## 2012-09-05 ENCOUNTER — Emergency Department (HOSPITAL_BASED_OUTPATIENT_CLINIC_OR_DEPARTMENT_OTHER)
Admission: EM | Admit: 2012-09-05 | Discharge: 2012-09-05 | Disposition: A | Discharge status: Home / Self Care | Payer: Medicaid Other | Attending: Emergency Medicine | Admitting: Emergency Medicine

## 2012-09-05 DIAGNOSIS — F17200 Nicotine dependence, unspecified, uncomplicated: Secondary | ICD-10-CM | POA: Insufficient documentation

## 2012-09-05 DIAGNOSIS — Z86718 Personal history of other venous thrombosis and embolism: Secondary | ICD-10-CM | POA: Insufficient documentation

## 2012-09-05 DIAGNOSIS — M542 Cervicalgia: Secondary | ICD-10-CM | POA: Insufficient documentation

## 2012-09-05 DIAGNOSIS — Z9089 Acquired absence of other organs: Secondary | ICD-10-CM | POA: Insufficient documentation

## 2012-09-05 DIAGNOSIS — Z809 Family history of malignant neoplasm, unspecified: Secondary | ICD-10-CM | POA: Insufficient documentation

## 2012-09-05 DIAGNOSIS — Z833 Family history of diabetes mellitus: Secondary | ICD-10-CM | POA: Insufficient documentation

## 2012-09-05 DIAGNOSIS — G8929 Other chronic pain: Secondary | ICD-10-CM | POA: Insufficient documentation

## 2012-09-05 MED ORDER — HYDROCODONE-ACETAMINOPHEN 5-325 MG PO TABS: 2.0000 | ORAL_TABLET | Freq: Three times a day (TID) | ORAL | Status: DC | PRN

## 2012-09-05 NOTE — ED Notes (Signed)
Pt has been seeing primary care provider. Prescribed vicodin for pain management, she ran out before her apt on [address] so the MD told her to come here.

## 2012-09-05 NOTE — ED Notes (Signed)
Pt states she has a hx of neck problems and is having trouble with same. Pain radiates down both arms at times.

## 2012-09-05 NOTE — ED Notes (Signed)
Discharge instructions reviewed. Pain management discussed. Pt verbalized understanding.

## 2012-09-05 NOTE — ED Provider Notes (Signed)
History  This chart was scribed for Hurman Horn, MD by Ladona Ridgel Day. This patient was seen in room MH01/MH01 and the patient's care was started at 1445.   CSN: [id number]  Arrival date & time 09/05/12  1445   First MD Initiated Contact with Patient 09/05/12 1645      Chief Complaint  Patient presents with  . Neck Pain   The history is provided by the patient. No language interpreter was used.   Barbara Moses is a 26 y.o. female who presents to the Emergency Department with chronic neck pain complaining of constant neck pain after she ran out of her pain medicine because one of her kids dumped them out accidentally. She states her neck pain intermittently radiates down one or both of her arms for hours at a time especially when she is lifting her children. She denies any recent injuries. She is scheduled for pain management consultation in about a week and her PCP told her to come here for pain medicine to last her until then. She denies any weakness, numbness, back pain, leg pain, SOB, CP, incontinence, fever, abdominal pain, rash.    Past Medical History  Diagnosis Date  . Gallstones   . Cervix, short (affecting pregnancy) 03/28/2011  . Anemia   . DVT (deep vein thrombosis) in pregnancy 12/17/2011  . Family history of DVT 12/17/2011  . Thyroid condition     Past Surgical History  Procedure Date  . Cholecystectomy   . Wisdom tooth extraction     Family History  Problem Relation Age of Onset  . Fibroids Mother   . Diabetes Father   . Crohn's disease Father   . Cancer Maternal Grandmother     History  Substance Use Topics  . Smoking status: Current Every Day Smoker -- 0.5 packs/day for 7 years    Types: Cigarettes  . Smokeless tobacco: Never Used  . Alcohol Use: Yes    OB History    Grav Para Term Preterm Abortions TAB SAB Ect Mult Living   2 2 1 1  0 0 0 0 0 2      Review of Systems 10 Systems reviewed and are negative for acute change except as noted in the  HPI.  Allergies  Review of patient's allergies indicates no known allergies.  Home Medications   Current Outpatient Rx  Name Route Sig Dispense Refill  . CLONAZEPAM 2 MG PO TABS Oral Take 2 mg by mouth 2 (two) times daily as needed. For anxiety.    . ENOXAPARIN SODIUM 150 MG/ML [place] SOLN Subcutaneous Inject 0.69 mLs (105 mg total) into the skin daily. 25 mL 1  . HYDROCODONE-ACETAMINOPHEN 5-325 MG PO TABS Oral Take 2 tablets by mouth every 8 (eight) hours as needed for pain. 30 tablet 0  . KETOROLAC TROMETHAMINE 10 MG PO TABS Oral Take 10 mg by mouth every 6 (six) hours as needed. For pain.    Marland Kitchen LEVOTHYROXINE SODIUM 100 MCG PO TABS Oral Take 1 tablet (100 mcg total) by mouth daily. 30 tablet 0  . TOPIRAMATE 25 MG PO CPSP Oral Take 25 mg by mouth 2 (two) times daily.      Triage Vitals: BP 112/50  Pulse 78  Temp 98.1 F (36.7 C) (Oral)  Resp 18  Ht 5' 2.5" (1.588 m)  Wt 132 lb (59.875 kg)  BMI 23.76 kg/m2  SpO2 99%  LMP 08/29/2012  Physical Exam  Nursing note and vitals reviewed. Constitutional:  Awake, alert, nontoxic appearance.  HENT:  Head: Atraumatic.  Mouth/Throat: No oropharyngeal exudate.  Eyes: EOM are normal. Pupils are equal, round, and reactive to light. Right eye exhibits no discharge. Left eye exhibits no discharge.  Neck: Neck supple.       Diffuse cervical and paracervical tenderness.   Cardiovascular: Normal rate, regular rhythm and normal heart sounds.   No murmur heard. Pulmonary/Chest: Effort normal and breath sounds normal. No stridor. No respiratory distress. She has no wheezes. She has no rales. She exhibits no tenderness.  Abdominal: Soft. Bowel sounds are normal. She exhibits no mass. There is no tenderness. There is no rebound.  Musculoskeletal: She exhibits no tenderness.       Baseline ROM, no obvious new focal weakness.  Lymphadenopathy:    She has no cervical adenopathy.  Neurological: She is alert.       Mental status and motor strength  appears baseline for patient and situation. Finger to nose intact. Light touch sensation intact BUE. Pupils equal and reactive extraocular movements intact peripheral fields show fields full to confrontation no facial asymmetry normal tongue movements, normal light touch in all 4 extremities as well as the face, motor strength out of 5 in all 4 extremities, she is intact light touch and motor strength in the distributions of the axillary radial median and ulnar nerve function in the upper extremities, upper extremity reflexes are symmetric bilaterally biceps triceps brachial radialis. She has normal bilateral finger to nose testing and normal gait.   Skin: No rash noted.  Psychiatric: She has a normal mood and affect.    ED Course  Procedures (including critical care time) DIAGNOSTIC STUDIES: Oxygen Saturation is 99% on room air, normal by my interpretation.    COORDINATION OF CARE: At 455 PM Discussed treatment plan with patient which includes pain medicine. Patient agrees.    Labs Reviewed  PREGNANCY, URINE   No results found.   1. Chronic neck pain       MDM   Pt stable in ED with no significant deterioration in condition.Patient / Family / Caregiver informed of clinical course, understand medical decision-making process, and agree with plan.I personally performed the services described in this documentation, which was scribed in my presence. The recorded information has been reviewed and considered.I doubt any other EMC precluding discharge at this time including, but not necessarily limited to the following:radiculopathy.        Hurman Horn, MD 09/05/12 [phone]

## 2012-10-24 ENCOUNTER — Encounter (HOSPITAL_COMMUNITY): Payer: Self-pay | Admitting: *Deleted

## 2012-10-24 ENCOUNTER — Emergency Department (HOSPITAL_COMMUNITY)
Admission: EM | Admit: 2012-10-24 | Discharge: 2012-10-24 | Disposition: A | Discharge status: Home / Self Care | Payer: Medicaid Other | Attending: Emergency Medicine | Admitting: Emergency Medicine

## 2012-10-24 DIAGNOSIS — R51 Headache: Secondary | ICD-10-CM | POA: Insufficient documentation

## 2012-10-24 DIAGNOSIS — J069 Acute upper respiratory infection, unspecified: Secondary | ICD-10-CM | POA: Insufficient documentation

## 2012-10-24 DIAGNOSIS — Z7901 Long term (current) use of anticoagulants: Secondary | ICD-10-CM | POA: Insufficient documentation

## 2012-10-24 DIAGNOSIS — Z862 Personal history of diseases of the blood and blood-forming organs and certain disorders involving the immune mechanism: Secondary | ICD-10-CM | POA: Insufficient documentation

## 2012-10-24 DIAGNOSIS — Z8719 Personal history of other diseases of the digestive system: Secondary | ICD-10-CM | POA: Insufficient documentation

## 2012-10-24 DIAGNOSIS — Z86718 Personal history of other venous thrombosis and embolism: Secondary | ICD-10-CM | POA: Insufficient documentation

## 2012-10-24 DIAGNOSIS — F17200 Nicotine dependence, unspecified, uncomplicated: Secondary | ICD-10-CM | POA: Insufficient documentation

## 2012-10-24 DIAGNOSIS — Z79899 Other long term (current) drug therapy: Secondary | ICD-10-CM | POA: Insufficient documentation

## 2012-10-24 DIAGNOSIS — Z9089 Acquired absence of other organs: Secondary | ICD-10-CM | POA: Insufficient documentation

## 2012-10-24 DIAGNOSIS — E079 Disorder of thyroid, unspecified: Secondary | ICD-10-CM | POA: Insufficient documentation

## 2012-10-24 DIAGNOSIS — J029 Acute pharyngitis, unspecified: Secondary | ICD-10-CM | POA: Insufficient documentation

## 2012-10-24 DIAGNOSIS — R05 Cough: Secondary | ICD-10-CM | POA: Insufficient documentation

## 2012-10-24 DIAGNOSIS — J3489 Other specified disorders of nose and nasal sinuses: Secondary | ICD-10-CM | POA: Insufficient documentation

## 2012-10-24 DIAGNOSIS — R059 Cough, unspecified: Secondary | ICD-10-CM | POA: Insufficient documentation

## 2012-10-24 MED ORDER — FEXOFENADINE-PSEUDOEPHED ER 180-240 MG PO TB24: 1.0000 | ORAL_TABLET | Freq: Every day | ORAL | Status: DC

## 2012-10-24 MED ORDER — OXYMETAZOLINE HCL 0.05 % NA SOLN: 2.0000 | Freq: Two times a day (BID) | NASAL | Status: DC

## 2012-10-24 NOTE — ED Provider Notes (Signed)
History  This chart was scribed for Raeford Razor, MD by Ladona Ridgel Day, ED scribe. This patient was seen in room TR08C/TR08C and the patient's care was started at 1555.   CSN: [id number]  Arrival date & time 10/24/12  1555   First MD Initiated Contact with Patient 10/24/12 1704      Chief Complaint  Patient presents with  . URI   Patient is a 26 y.o. female presenting with cough. The history is provided by the patient. No language interpreter was used.  Cough This is a new problem. The current episode started more than 2 days ago. The problem occurs constantly. The problem has been gradually worsening. The cough is productive of brown sputum. There has been no fever. Associated symptoms include headaches, rhinorrhea and sore throat. Pertinent negatives include no chest pain, no chills and no shortness of breath. She has tried decongestants (tylenol) for the symptoms. The treatment provided mild relief. She is a smoker.    Past Medical History  Diagnosis Date  . Gallstones   . Cervix, short (affecting pregnancy) 03/28/2011  . Anemia   . DVT (deep vein thrombosis) in pregnancy 12/17/2011  . Family history of DVT 12/17/2011  . Thyroid condition     Past Surgical History  Procedure Date  . Cholecystectomy   . Wisdom tooth extraction     Family History  Problem Relation Age of Onset  . Fibroids Mother   . Diabetes Father   . Crohn's disease Father   . Cancer Maternal Grandmother     History  Substance Use Topics  . Smoking status: Current Every Day Smoker -- 0.5 packs/day for 7 years    Types: Cigarettes  . Smokeless tobacco: Never Used  . Alcohol Use: Yes    OB History    Grav Para Term Preterm Abortions TAB SAB Ect Mult Living   2 2 1 1  0 0 0 0 0 2      Review of Systems  Constitutional: Negative for fever and chills.  HENT: Positive for congestion, sore throat and rhinorrhea.   Respiratory: Positive for cough. Negative for shortness of breath.   Cardiovascular:  Negative for chest pain.  Gastrointestinal: Negative for nausea, vomiting and abdominal pain.  Musculoskeletal: Negative for back pain.  Neurological: Positive for headaches. Negative for weakness.  All other systems reviewed and are negative.    Allergies  Penicillins and Tramadol  Home Medications   Current Outpatient Rx  Name  Route  Sig  Dispense  Refill  . ACETAMINOPHEN 500 MG PO TABS   Oral   Take 1,500 mg by mouth every 6 (six) hours as needed. For pain         . COLD & ALLERGY PO   Oral   Take 2 tablets by mouth daily as needed. For cold symptms         . LEVOTHYROXINE SODIUM 50 MCG PO TABS   Oral   Take 50 mcg by mouth daily.         Marland Kitchen ENOXAPARIN SODIUM 150 MG/ML [place] SOLN   Subcutaneous   Inject 0.69 mLs (105 mg total) into the skin daily.   25 mL   1     LMP 10/24/2012  Physical Exam  Nursing note and vitals reviewed. Constitutional: She appears well-developed and well-nourished. No distress.  HENT:  Head: Normocephalic and atraumatic.  Right Ear: External ear normal.  Left Ear: External ear normal.       Moderate frontal and maxillary sinus  tenderness. Bilateral TMs clear  Eyes: Conjunctivae normal are normal. Right eye exhibits no discharge. Left eye exhibits no discharge.  Neck: Neck supple.  Cardiovascular: Normal rate, regular rhythm and normal heart sounds.  Exam reveals no gallop and no friction rub.   No murmur heard. Pulmonary/Chest: Effort normal and breath sounds normal. No respiratory distress.  Abdominal: Soft. Bowel sounds are normal. She exhibits no distension. There is no tenderness.  Musculoskeletal: She exhibits no edema and no tenderness.       No unilateral leg swelling/tenderness  Lymphadenopathy:    She has no cervical adenopathy.  Neurological: She is alert.  Skin: Skin is warm and dry.  Psychiatric: She has a normal mood and affect. Her behavior is normal. Thought content normal.    ED Course  Procedures (including  critical care time) DIAGNOSTIC STUDIES: Oxygen Saturation is 99% on room air, normal by my interpretation.    COORDINATION OF CARE: At 550 PM Discussed treatment plan with patient which includes return to ED if your symptoms change or worsen. Patient agrees.    Labs Reviewed - No data to display No results found.   1. URI (upper respiratory infection)       MDM  26yF with a cold. Very low suspicion for serious bacterial illness. Plan symptomatic tx. Return precautions discussed.  I personally preformed the services scribed in my presence. The recorded information has been reviewed and is accurate. Raeford Razor, MD.         Raeford Razor, MD 10/28/12 [phone]

## 2012-10-24 NOTE — ED Notes (Signed)
Reports recent cold symptoms with congestion, facial swelling and tooth pain. Airway intact, no distress noted.

## 2012-11-23 IMAGING — US US ABDOMEN COMPLETE
1 series · 13 of 25 positions shown · non-contrast
Comparison: 3005

CLINICAL DATA: The right upper quadrant and back pain.  27 weeks
estimated gestational age.  Rule out pancreatic stones.  Post
cholecystectomy.

COMPLETE ABDOMINAL ULTRASOUND

[Series 1: us abdomen complete · 102 acquisitions, 13 frames shown]
[im 1/102]
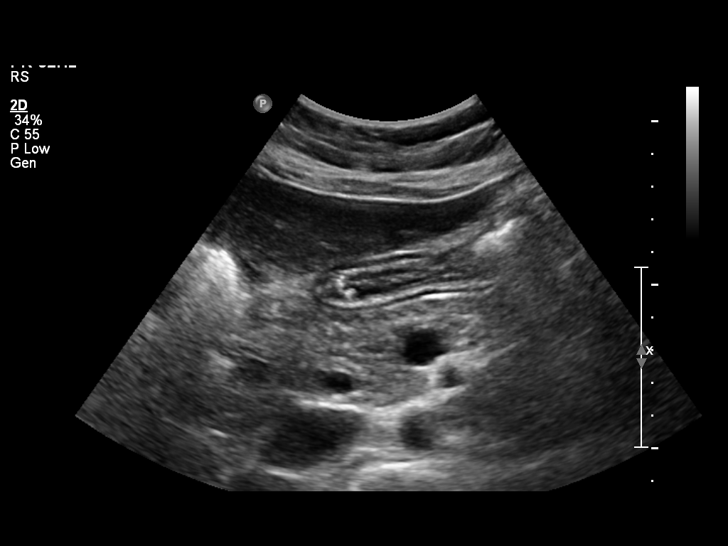
[im 9/102]
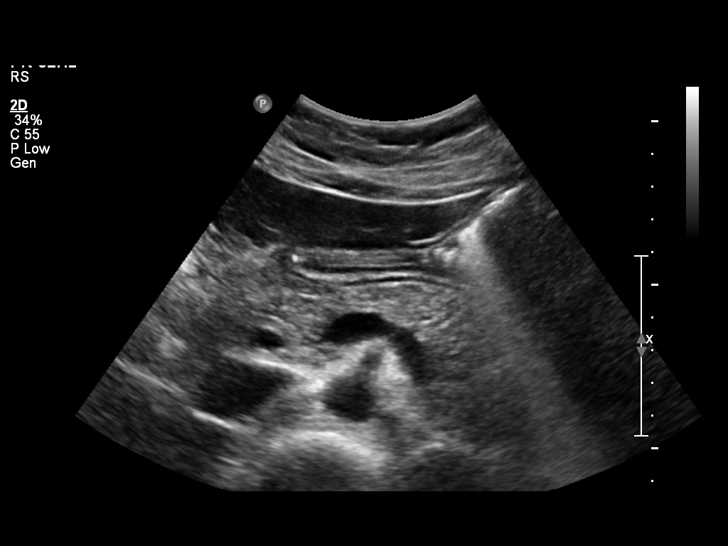
[im 17/102]
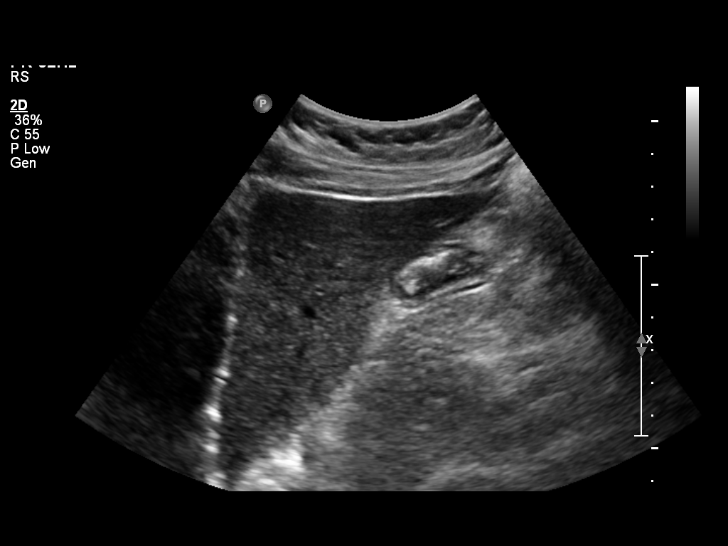
[im 26/102]
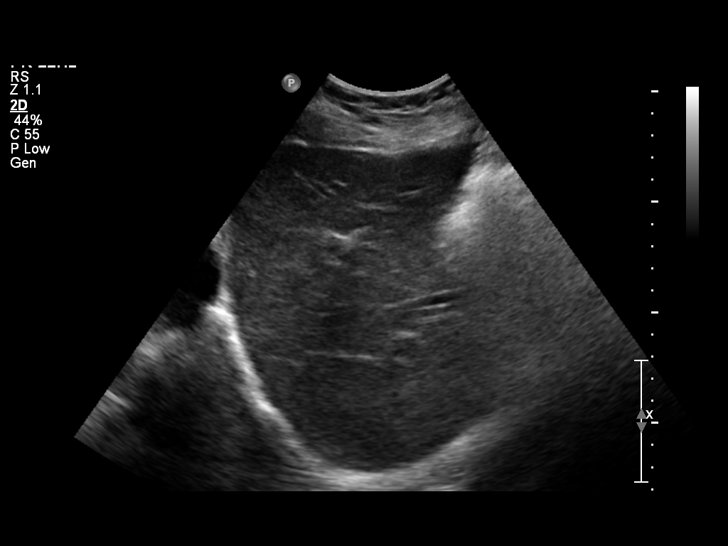
[im 34/102]
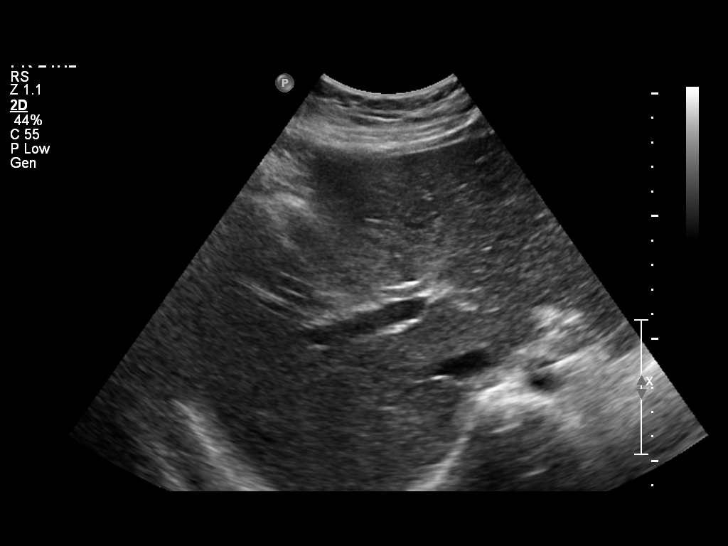
[im 43/102]
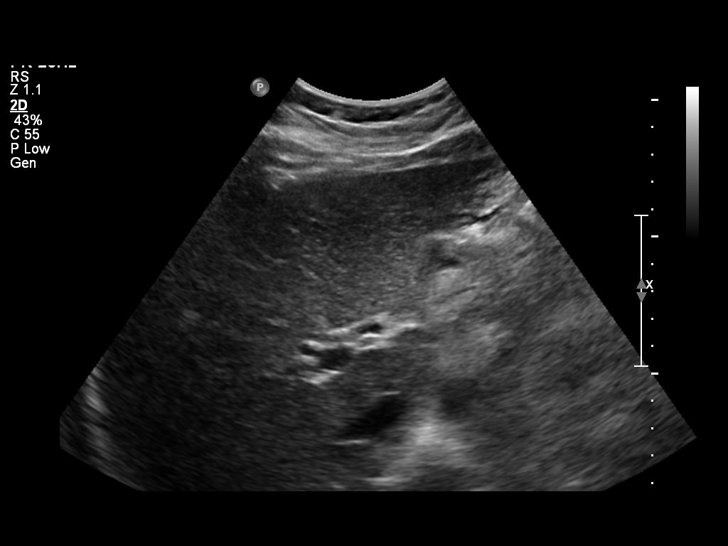
[im 51/102]
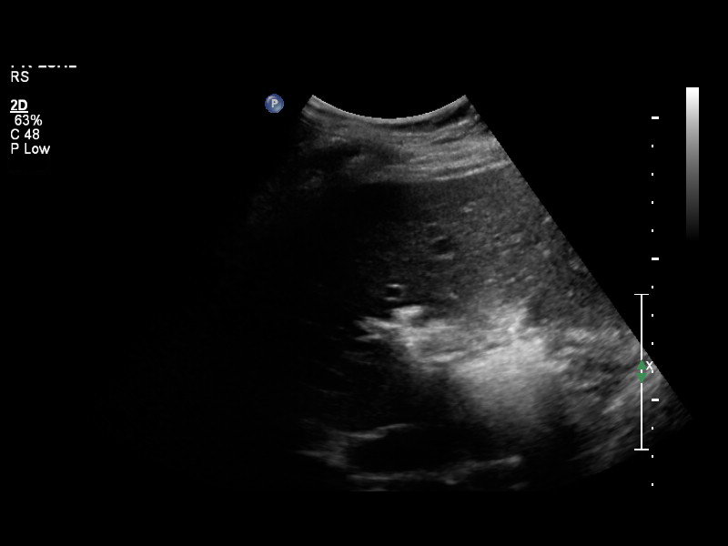
[im 59/102]
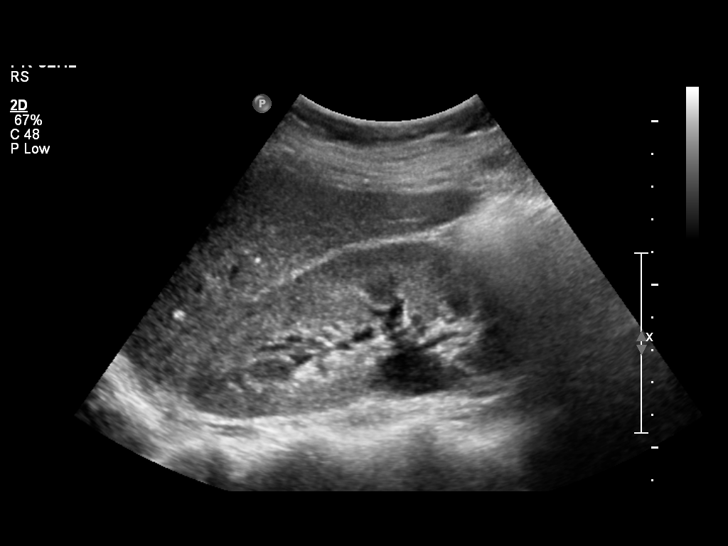
[im 68/102]
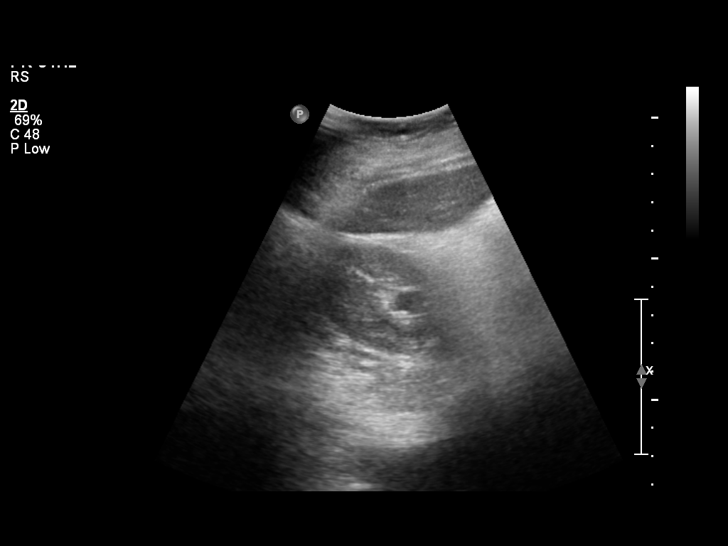
[im 76/102]
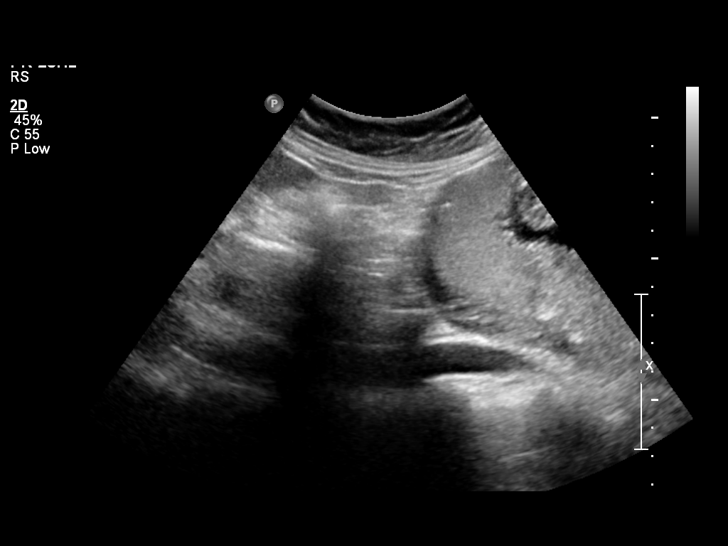
[im 85/102]
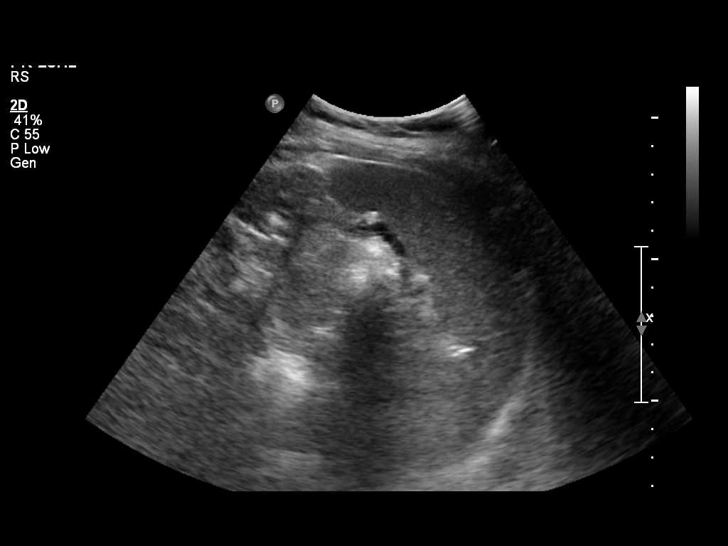
[im 93/102]
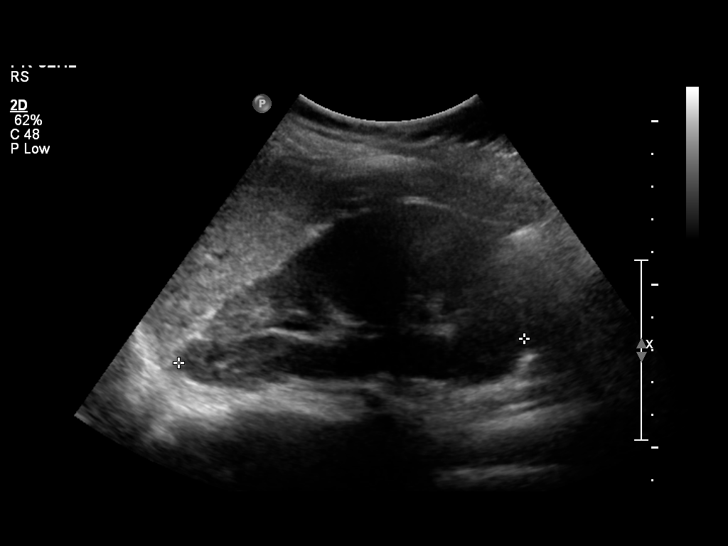
[im 102/102]
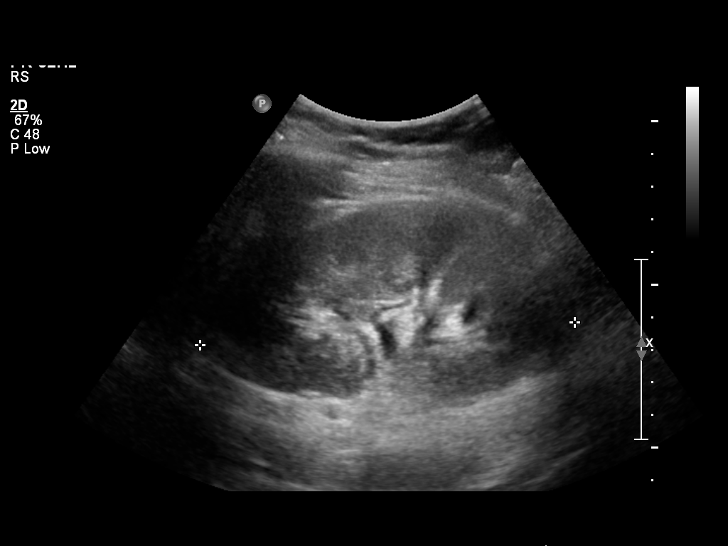

[13 of 25 positions shown; findings below may reference images not displayed]

FINDINGS: Gallbladder:  Has been surgically removed.

Common bile duct:  Has a normal appearance with a diameter of
mm.  No sonographic evidence for choledocholithiasis is seen.

Liver:  No focal lesion identified.  Within normal limits in
parenchymal echogenicity.No signs of intrahepatic ductal dilatation
are noted

IVC:  Appears normal.

Pancreas:  No focal abnormality seen. No signs of pancreatic
calcification or pancreatic ductal dilatation are apparent

Spleen:  Has a sagittal length of 7.2 mm.  No areas of focal
parenchymal abnormality are seen

Right Kidney:  Has a sagittal length of 12.7 cm.  Incidental note
is made of an extrarenal pelvis.  No hydronephrosis is noted.  No
focal parenchymal abnormalities are seen.

Left Kidney:  Has a sagittal length of 11.5 cm.  No hydronephrosis
or focal parenchymal abnormality is identified.

Abdominal aorta:  Has a maximal caliber of 1.6 cm with no
aneurysmal dilatation noted.
IMPRESSION: Normal post cholecystectomy abdominal ultrasound.

## 2013-05-24 ENCOUNTER — Emergency Department (HOSPITAL_BASED_OUTPATIENT_CLINIC_OR_DEPARTMENT_OTHER): Payer: Medicaid Other

## 2013-05-24 ENCOUNTER — Encounter (HOSPITAL_BASED_OUTPATIENT_CLINIC_OR_DEPARTMENT_OTHER): Payer: Self-pay | Admitting: *Deleted

## 2013-05-24 ENCOUNTER — Emergency Department (HOSPITAL_BASED_OUTPATIENT_CLINIC_OR_DEPARTMENT_OTHER)
Admission: EM | Admit: 2013-05-24 | Discharge: 2013-05-24 | Disposition: A | Discharge status: Home / Self Care | Payer: Medicaid Other | Attending: Emergency Medicine | Admitting: Emergency Medicine

## 2013-05-24 DIAGNOSIS — E079 Disorder of thyroid, unspecified: Secondary | ICD-10-CM | POA: Insufficient documentation

## 2013-05-24 DIAGNOSIS — Z79899 Other long term (current) drug therapy: Secondary | ICD-10-CM | POA: Insufficient documentation

## 2013-05-24 DIAGNOSIS — Z880 Allergy status to penicillin: Secondary | ICD-10-CM | POA: Insufficient documentation

## 2013-05-24 DIAGNOSIS — Z9089 Acquired absence of other organs: Secondary | ICD-10-CM | POA: Insufficient documentation

## 2013-05-24 DIAGNOSIS — A499 Bacterial infection, unspecified: Secondary | ICD-10-CM | POA: Insufficient documentation

## 2013-05-24 DIAGNOSIS — Z8742 Personal history of other diseases of the female genital tract: Secondary | ICD-10-CM | POA: Insufficient documentation

## 2013-05-24 DIAGNOSIS — F17200 Nicotine dependence, unspecified, uncomplicated: Secondary | ICD-10-CM | POA: Insufficient documentation

## 2013-05-24 DIAGNOSIS — N760 Acute vaginitis: Secondary | ICD-10-CM | POA: Insufficient documentation

## 2013-05-24 DIAGNOSIS — B9689 Other specified bacterial agents as the cause of diseases classified elsewhere: Secondary | ICD-10-CM | POA: Insufficient documentation

## 2013-05-24 DIAGNOSIS — R300 Dysuria: Secondary | ICD-10-CM | POA: Insufficient documentation

## 2013-05-24 DIAGNOSIS — Z86718 Personal history of other venous thrombosis and embolism: Secondary | ICD-10-CM | POA: Insufficient documentation

## 2013-05-24 DIAGNOSIS — Z862 Personal history of diseases of the blood and blood-forming organs and certain disorders involving the immune mechanism: Secondary | ICD-10-CM | POA: Insufficient documentation

## 2013-05-24 DIAGNOSIS — Z8719 Personal history of other diseases of the digestive system: Secondary | ICD-10-CM | POA: Insufficient documentation

## 2013-05-24 DIAGNOSIS — N390 Urinary tract infection, site not specified: Secondary | ICD-10-CM | POA: Insufficient documentation

## 2013-05-24 DIAGNOSIS — Z3202 Encounter for pregnancy test, result negative: Secondary | ICD-10-CM | POA: Insufficient documentation

## 2013-05-24 LAB — PREGNANCY, URINE: Preg Test, Ur: NEGATIVE

## 2013-05-24 LAB — URINALYSIS, ROUTINE W REFLEX MICROSCOPIC
Glucose, UA: NEGATIVE mg/dL
Leukocytes, UA: NEGATIVE
Specific Gravity, Urine: 1.03 (ref 1.005–1.030)
pH: 6 (ref 5.0–8.0)

## 2013-05-24 LAB — URINE MICROSCOPIC-ADD ON

## 2013-05-24 LAB — WET PREP, GENITAL
Trich, Wet Prep: NONE SEEN
WBC, Wet Prep HPF POC: NONE SEEN
Yeast Wet Prep HPF POC: NONE SEEN

## 2013-05-24 MED ORDER — IBUPROFEN 800 MG PO TABS: 800.0000 mg | ORAL_TABLET | Freq: Three times a day (TID) | ORAL | Status: DC

## 2013-05-24 MED ORDER — METRONIDAZOLE 500 MG PO TABS: 500.0000 mg | ORAL_TABLET | Freq: Two times a day (BID) | ORAL | Status: DC

## 2013-05-24 MED ORDER — HYDROCODONE-ACETAMINOPHEN 5-325 MG PO TABS: 2.0000 | ORAL_TABLET | ORAL | Status: DC | PRN

## 2013-05-24 MED ORDER — NITROFURANTOIN MONOHYD MACRO 100 MG PO CAPS: 100.0000 mg | ORAL_CAPSULE | Freq: Two times a day (BID) | ORAL | Status: AC

## 2013-05-24 NOTE — ED Notes (Signed)
Pt states "I feel good, I don't feel sick at all..."

## 2013-05-24 NOTE — ED Notes (Signed)
Lower abdominal pain thinks may be ovarian cyst states she called pmd regarding it and was told to go to the ER

## 2013-05-24 NOTE — ED Provider Notes (Signed)
History     CSN: [id number]  Arrival date & time 05/24/13  1124   First MD Initiated Contact with Patient 05/24/13 1237      Chief Complaint  Patient presents with  . Abdominal Pain    (Consider location/radiation/quality/duration/timing/severity/associated sxs/prior treatment) Patient is a 27 y.o. female presenting with cramps. The history is provided by the patient. No language interpreter was used.  Abdominal Cramping This is a new problem. The current episode started today. The problem occurs constantly. The problem has been gradually worsening. Associated symptoms include abdominal pain. Nothing aggravates the symptoms. She has tried nothing for the symptoms. The treatment provided moderate relief.  Pt complains of lower abdominal pain.  Pt feels like she has an ovarian cyst.    Past Medical History  Diagnosis Date  . Gallstones   . Cervix, short (affecting pregnancy) 03/28/2011  . Anemia   . DVT (deep vein thrombosis) in pregnancy 12/17/2011  . Family history of DVT 12/17/2011  . Thyroid condition     Past Surgical History  Procedure Laterality Date  . Cholecystectomy    . Wisdom tooth extraction      Family History  Problem Relation Age of Onset  . Fibroids Mother   . Diabetes Father   . Crohn's disease Father   . Cancer Maternal Grandmother     History  Substance Use Topics  . Smoking status: Current Every Day Smoker -- 0.50 packs/day for 7 years    Types: Cigarettes  . Smokeless tobacco: Never Used  . Alcohol Use: Yes    OB History   Grav Para Term Preterm Abortions TAB SAB Ect Mult Living   2 2 1 1  0 0 0 0 0 2      Review of Systems  Gastrointestinal: Positive for abdominal pain.  Genitourinary: Positive for dysuria. Negative for vaginal discharge.  All other systems reviewed and are negative.    Allergies  Penicillins and Tramadol  Home Medications   Current Outpatient Rx  Name  Route  Sig  Dispense  Refill  . DULoxetine (CYMBALTA) 30 MG  capsule   Oral   Take 30 mg by mouth daily.         Marland Kitchen acetaminophen (TYLENOL) 500 MG tablet   Oral   Take 1,500 mg by mouth every 6 (six) hours as needed. For pain         . Brompheniramine-Phenylephrine (COLD & ALLERGY PO)   Oral   Take 2 tablets by mouth daily as needed. For cold symptms         . EXPIRED: enoxaparin (LOVENOX) 150 MG/ML injection   Subcutaneous   Inject 0.69 mLs (105 mg total) into the skin daily.   25 mL   1   . fexofenadine-pseudoephedrine (ALLEGRA-D 24 HOUR) 180-240 MG per 24 hr tablet   Oral   Take 1 tablet by mouth daily.   10 tablet   0   . levothyroxine (SYNTHROID, LEVOTHROID) 50 MCG tablet   Oral   Take 50 mcg by mouth daily.         Marland Kitchen oxymetazoline (AFRIN NASAL SPRAY) 0.05 % nasal spray   Nasal   Place 2 sprays into the nose 2 (two) times daily.   15 mL   0     For 3 days     BP 104/87  Pulse 84  Temp(Src) 98.3 F (36.8 C) (Oral)  Ht 5\' 2"  (1.575 m)  Wt 135 lb (61.236 kg)  BMI 24.69 kg/m2  SpO2 100%  LMP 05/21/2013  Physical Exam  Nursing note and vitals reviewed. Constitutional: She appears well-developed and well-nourished.  HENT:  Head: Normocephalic.  Right Ear: External ear normal.  Eyes: Pupils are equal, round, and reactive to light.  Neck: Normal range of motion.  Cardiovascular: Normal rate and normal heart sounds.   Pulmonary/Chest: Effort normal and breath sounds normal.  Abdominal: Soft.  Genitourinary: Vaginal discharge found.  Musculoskeletal: Normal range of motion.  Neurological: She is alert.  Skin: Skin is warm.  Psychiatric: She has a normal mood and affect.    ED Course  Procedures (including critical care time)  Labs Reviewed  URINALYSIS, ROUTINE W REFLEX MICROSCOPIC - Abnormal; Notable for the following:    APPearance CLOUDY (*)    Hgb urine dipstick LARGE (*)    Protein, ur 30 (*)    All other components within normal limits  URINE MICROSCOPIC-ADD ON - Abnormal; Notable for the  following:    Squamous Epithelial / LPF MANY (*)    Bacteria, UA FEW (*)    All other components within normal limits  URINE CULTURE  PREGNANCY, URINE   No results found.   No diagnosis found.    MDM   Results for orders placed during the hospital encounter of 05/24/13  WET PREP, GENITAL      Result Value Range   Yeast Wet Prep HPF POC NONE SEEN  NONE SEEN   Trich, Wet Prep NONE SEEN  NONE SEEN   Clue Cells Wet Prep HPF POC FEW (*) NONE SEEN   WBC, Wet Prep HPF POC NONE SEEN  NONE SEEN  URINALYSIS, ROUTINE W REFLEX MICROSCOPIC      Result Value Range   Color, Urine YELLOW  YELLOW   APPearance CLOUDY (*) CLEAR   Specific Gravity, Urine 1.030  1.005 - 1.030   pH 6.0  5.0 - 8.0   Glucose, UA NEGATIVE  NEGATIVE mg/dL   Hgb urine dipstick LARGE (*) NEGATIVE   Bilirubin Urine NEGATIVE  NEGATIVE   Ketones, ur NEGATIVE  NEGATIVE mg/dL   Protein, ur 30 (*) NEGATIVE mg/dL   Urobilinogen, UA 0.2  0.0 - 1.0 mg/dL   Nitrite NEGATIVE  NEGATIVE   Leukocytes, UA NEGATIVE  NEGATIVE  PREGNANCY, URINE      Result Value Range   Preg Test, Ur NEGATIVE  NEGATIVE  URINE MICROSCOPIC-ADD ON      Result Value Range   Squamous Epithelial / LPF MANY (*) RARE   WBC, UA 3-6  <3 WBC/hpf   RBC / HPF 3-6  <3 RBC/hpf   Bacteria, UA FEW (*) RARE   Urine-Other MUCOUS PRESENT     US Transvaginal Non-ob  05/24/2013   *RADIOLOGY REPORT*  Clinical Data: Pelvic pain  TRANSABDOMINAL AND TRANSVAGINAL ULTRASOUND OF PELVIS Technique:  Both transabdominal and transvaginal ultrasound examinations of the pelvis were performed. Transabdominal technique was performed for global imaging of the pelvis including uterus, ovaries, adnexal regions, and pelvic cul-de-sac.  It was necessary to proceed with endovaginal exam following the transabdominal exam to visualize the uterus and ovaries.  Comparison:  09/26/2009 and 11/11/2011  Findings:  Uterus: Normal in size and appearance.  Uterus measures 7.3 x 3.5 x 4.8 cm.   Normal anteverted position.  Endometrium: Measures 0.5 cm.  Right ovary:  Right ovary measures 2.6 x 1.9 x 1.9 cm. Normal appearance.  Left ovary: Left ovary measures 2.1 x 2.9 x 2.0 cm with small follicles.  Other findings: No free fluid  IMPRESSION: Normal study. No evidence of pelvic mass or other significant abnormality.   Original Report Authenticated By: Richarda Overlie, M.D.   US Pelvis Complete  05/24/2013   *RADIOLOGY REPORT*  Clinical Data: Pelvic pain  TRANSABDOMINAL AND TRANSVAGINAL ULTRASOUND OF PELVIS Technique:  Both transabdominal and transvaginal ultrasound examinations of the pelvis were performed. Transabdominal technique was performed for global imaging of the pelvis including uterus, ovaries, adnexal regions, and pelvic cul-de-sac.  It was necessary to proceed with endovaginal exam following the transabdominal exam to visualize the uterus and ovaries.  Comparison:  09/26/2009 and 11/11/2011  Findings:  Uterus: Normal in size and appearance.  Uterus measures 7.3 x 3.5 x 4.8 cm.  Normal anteverted position.  Endometrium: Measures 0.5 cm.  Right ovary:  Right ovary measures 2.6 x 1.9 x 1.9 cm. Normal appearance.  Left ovary: Left ovary measures 2.1 x 2.9 x 2.0 cm with small follicles.  Other findings: No free fluid  IMPRESSION: Normal study. No evidence of pelvic mass or other significant abnormality.   Original Report Authenticated By: Richarda Overlie, M.D.    No ovarian cyst.  Pt given rx for flagyl and macrobid,  Ibuprofen and hydrocodone.          Lonia Skinner Keene, PA-C 05/24/13 1605

## 2013-05-25 LAB — URINE CULTURE

## 2013-05-25 NOTE — ED Provider Notes (Signed)
Medical screening examination/treatment/procedure(s) were performed by non-physician practitioner and as supervising physician I was immediately available for consultation/collaboration.   Gwyneth Sprout, MD 05/25/13 [phone]

## 2013-06-02 ENCOUNTER — Emergency Department (HOSPITAL_BASED_OUTPATIENT_CLINIC_OR_DEPARTMENT_OTHER): Payer: Medicaid Other

## 2013-06-02 ENCOUNTER — Encounter (HOSPITAL_BASED_OUTPATIENT_CLINIC_OR_DEPARTMENT_OTHER): Payer: Self-pay

## 2013-06-02 ENCOUNTER — Emergency Department (HOSPITAL_BASED_OUTPATIENT_CLINIC_OR_DEPARTMENT_OTHER)
Admission: EM | Admit: 2013-06-02 | Discharge: 2013-06-02 | Disposition: A | Discharge status: Home / Self Care | Payer: Medicaid Other | Attending: Emergency Medicine | Admitting: Emergency Medicine

## 2013-06-02 DIAGNOSIS — Z8742 Personal history of other diseases of the female genital tract: Secondary | ICD-10-CM | POA: Insufficient documentation

## 2013-06-02 DIAGNOSIS — Z79899 Other long term (current) drug therapy: Secondary | ICD-10-CM | POA: Insufficient documentation

## 2013-06-02 DIAGNOSIS — Z862 Personal history of diseases of the blood and blood-forming organs and certain disorders involving the immune mechanism: Secondary | ICD-10-CM | POA: Insufficient documentation

## 2013-06-02 DIAGNOSIS — Z880 Allergy status to penicillin: Secondary | ICD-10-CM | POA: Insufficient documentation

## 2013-06-02 DIAGNOSIS — Z86718 Personal history of other venous thrombosis and embolism: Secondary | ICD-10-CM | POA: Insufficient documentation

## 2013-06-02 DIAGNOSIS — F17200 Nicotine dependence, unspecified, uncomplicated: Secondary | ICD-10-CM | POA: Insufficient documentation

## 2013-06-02 DIAGNOSIS — Z8619 Personal history of other infectious and parasitic diseases: Secondary | ICD-10-CM | POA: Insufficient documentation

## 2013-06-02 DIAGNOSIS — N949 Unspecified condition associated with female genital organs and menstrual cycle: Secondary | ICD-10-CM | POA: Insufficient documentation

## 2013-06-02 DIAGNOSIS — Z3202 Encounter for pregnancy test, result negative: Secondary | ICD-10-CM | POA: Insufficient documentation

## 2013-06-02 DIAGNOSIS — E079 Disorder of thyroid, unspecified: Secondary | ICD-10-CM | POA: Insufficient documentation

## 2013-06-02 DIAGNOSIS — R109 Unspecified abdominal pain: Secondary | ICD-10-CM | POA: Insufficient documentation

## 2013-06-02 DIAGNOSIS — Z8719 Personal history of other diseases of the digestive system: Secondary | ICD-10-CM | POA: Insufficient documentation

## 2013-06-02 HISTORY — DX: Acute vaginitis: N76.0

## 2013-06-02 HISTORY — DX: Other specified bacterial agents as the cause of diseases classified elsewhere: B96.89

## 2013-06-02 HISTORY — DX: Acute embolism and thrombosis of unspecified deep veins of unspecified lower extremity: I82.409

## 2013-06-02 LAB — URINE MICROSCOPIC-ADD ON

## 2013-06-02 LAB — CBC WITH DIFFERENTIAL/PLATELET
Basophils Absolute: 0.1 10*3/uL (ref 0.0–0.1)
Basophils Relative: 1 % (ref 0–1)
Hemoglobin: 13.2 g/dL (ref 12.0–15.0)
Lymphocytes Relative: 37 % (ref 12–46)
MCHC: 35.3 g/dL (ref 30.0–36.0)
Monocytes Relative: 6 % (ref 3–12)
Neutro Abs: 4.3 10*3/uL (ref 1.7–7.7)
Neutrophils Relative %: 48 % (ref 43–77)
RDW: 13.4 % (ref 11.5–15.5)
WBC: 8.9 10*3/uL (ref 4.0–10.5)

## 2013-06-02 LAB — COMPREHENSIVE METABOLIC PANEL
ALT: 12 U/L (ref 0–35)
AST: 16 U/L (ref 0–37)
Albumin: 4.2 g/dL (ref 3.5–5.2)
Alkaline Phosphatase: 96 U/L (ref 39–117)
Chloride: 105 mEq/L (ref 96–112)
Potassium: 3.7 mEq/L (ref 3.5–5.1)
Total Bilirubin: 0.2 mg/dL — ABNORMAL LOW (ref 0.3–1.2)

## 2013-06-02 LAB — URINALYSIS, ROUTINE W REFLEX MICROSCOPIC
Bilirubin Urine: NEGATIVE
Glucose, UA: NEGATIVE mg/dL
Nitrite: NEGATIVE
Specific Gravity, Urine: 1.017 (ref 1.005–1.030)
pH: 5.5 (ref 5.0–8.0)

## 2013-06-02 LAB — PREGNANCY, URINE: Preg Test, Ur: NEGATIVE

## 2013-06-02 MED ORDER — HYDROCODONE-ACETAMINOPHEN 5-325 MG PO TABS
1.0000 | ORAL_TABLET | Freq: Four times a day (QID) | ORAL | Status: DC | PRN
Start: 2013-06-02 — End: 2015-01-18

## 2013-06-02 NOTE — ED Notes (Signed)
C/o cont'd abd pain after completing tx for UTI and BV

## 2013-06-02 NOTE — ED Notes (Signed)
Patient transported to Ultrasound 

## 2013-06-02 NOTE — ED Provider Notes (Signed)
History    CSN: [id number] Arrival date & time 06/02/13  1457  First MD Initiated Contact with Patient 06/02/13 1536     Chief Complaint  Patient presents with  . Abdominal Pain   (Consider location/radiation/quality/duration/timing/severity/associated sxs/prior Treatment) HPI Comments: Pt seen here 10 days ago when developed shifting abd pain between the right and left pelvis.  Had UA, wet prep, and pelvic u/s done showing BV and possible UTI.  UPT was neg at that time and pt was on menses.  She was given abx and despite finishing 10 days yesterday still having pain and now occasional right flank pain that is 9/10 when it hurts but lasts only minutes and then stops.  No N/V/D or fever.  No SOB, cough.  Pain is not worsened by eating or activity.  Currently not sexually active.  States in the past has had pelvic blood clot after birth of her son requiring lovenox but this does not feel like that and states feels like prior ovarian cysts.  Denies dysuria and no vaginal sx.  Patient is a 27 y.o. female presenting with abdominal pain. The history is provided by the patient.  Abdominal Pain This is a new problem. Episode onset: 10 days. The problem occurs constantly. The problem has not changed since onset.Associated symptoms include abdominal pain. Pertinent negatives include no shortness of breath. Nothing aggravates the symptoms. Nothing relieves the symptoms. Treatments tried: took antibiotics without improvement. The treatment provided no relief.   Past Medical History  Diagnosis Date  . Gallstones   . Cervix, short (affecting pregnancy) 03/28/2011  . Anemia   . Family history of DVT 12/17/2011  . Thyroid condition   . BV (bacterial vaginosis)   . DVT (deep venous thrombosis)    Past Surgical History  Procedure Laterality Date  . Cholecystectomy    . Wisdom tooth extraction     Family History  Problem Relation Age of Onset  . Fibroids Mother   . Diabetes Father   . Crohn's  disease Father   . Cancer Maternal Grandmother    History  Substance Use Topics  . Smoking status: Current Every Day Smoker -- 0.50 packs/day for 7 years    Types: Cigarettes  . Smokeless tobacco: Never Used  . Alcohol Use: No   OB History   Grav Para Term Preterm Abortions TAB SAB Ect Mult Living   2 2 1 1  0 0 0 0 0 2     Review of Systems  Constitutional: Negative for fever and chills.  Respiratory: Negative for cough and shortness of breath.   Gastrointestinal: Positive for abdominal pain. Negative for nausea, vomiting and diarrhea.  Genitourinary: Positive for flank pain and pelvic pain. Negative for dysuria, hematuria, vaginal bleeding, vaginal discharge and menstrual problem.  All other systems reviewed and are negative.    Allergies  Penicillins and Tramadol  Home Medications   Current Outpatient Rx  Name  Route  Sig  Dispense  Refill  . acetaminophen (TYLENOL) 500 MG tablet   Oral   Take 1,500 mg by mouth every 6 (six) hours as needed. For pain         . Brompheniramine-Phenylephrine (COLD & ALLERGY PO)   Oral   Take 2 tablets by mouth daily as needed. For cold symptms         . DULoxetine (CYMBALTA) 30 MG capsule   Oral   Take 30 mg by mouth daily.         Marland Kitchen EXPIRED:  enoxaparin (LOVENOX) 150 MG/ML injection   Subcutaneous   Inject 0.69 mLs (105 mg total) into the skin daily.   25 mL   1   . fexofenadine-pseudoephedrine (ALLEGRA-D 24 HOUR) 180-240 MG per 24 hr tablet   Oral   Take 1 tablet by mouth daily.   10 tablet   0   . HYDROcodone-acetaminophen (NORCO/VICODIN) 5-325 MG per tablet   Oral   Take 2 tablets by mouth every 4 (four) hours as needed.   20 tablet   0   . ibuprofen (ADVIL,MOTRIN) 800 MG tablet   Oral   Take 1 tablet (800 mg total) by mouth 3 (three) times daily.   21 tablet   0   . levothyroxine (SYNTHROID, LEVOTHROID) 50 MCG tablet   Oral   Take 50 mcg by mouth daily.         . metroNIDAZOLE (FLAGYL) 500 MG tablet    Oral   Take 1 tablet (500 mg total) by mouth 2 (two) times daily.   14 tablet   0   . nitrofurantoin, macrocrystal-monohydrate, (MACROBID) 100 MG capsule   Oral   Take 1 capsule (100 mg total) by mouth 2 (two) times daily.   14 capsule   0   . oxymetazoline (AFRIN NASAL SPRAY) 0.05 % nasal spray   Nasal   Place 2 sprays into the nose 2 (two) times daily.   15 mL   0     For 3 days    BP 106/68  Pulse 90  Temp(Src) 98.9 F (37.2 C) (Oral)  Resp 18  Ht 5\' 2"  (1.575 m)  Wt 133 lb (60.328 kg)  BMI 24.32 kg/m2  SpO2 100%  LMP 05/21/2013 Physical Exam  Nursing note and vitals reviewed. Constitutional: She is oriented to person, place, and time. She appears well-developed and well-nourished. No distress.  HENT:  Head: Normocephalic and atraumatic.  Eyes: EOM are normal. Pupils are equal, round, and reactive to light.  Cardiovascular: Normal rate, regular rhythm, normal heart sounds and intact distal pulses.  Exam reveals no friction rub.   No murmur heard. Pulmonary/Chest: Effort normal and breath sounds normal. She has no wheezes. She has no rales.  Abdominal: Soft. Bowel sounds are normal. She exhibits no distension. There is tenderness in the epigastric area and suprapubic area. There is CVA tenderness. There is no rebound and no guarding.    Right CVA tenderness  Musculoskeletal: Normal range of motion. She exhibits no tenderness.  No edema  Neurological: She is alert and oriented to person, place, and time. No cranial nerve deficit.  Skin: Skin is warm and dry. No rash noted.  Psychiatric: She has a normal mood and affect. Her behavior is normal.    ED Course  Procedures (including critical care time) Labs Reviewed  URINALYSIS, ROUTINE W REFLEX MICROSCOPIC - Abnormal; Notable for the following:    Leukocytes, UA TRACE (*)    All other components within normal limits  CBC WITH DIFFERENTIAL - Abnormal; Notable for the following:    Eosinophils Relative 8 (*)     All other components within normal limits  COMPREHENSIVE METABOLIC PANEL - Abnormal; Notable for the following:    Total Bilirubin 0.2 (*)    All other components within normal limits  PREGNANCY, URINE  URINE MICROSCOPIC-ADD ON   US Renal  06/02/2013   *RADIOLOGY REPORT*  Clinical Data: Continued right abdominal pain, more towards the right flank.  Cholecystectomy.  RENAL/URINARY TRACT ULTRASOUND COMPLETE  Comparison:  CT abdomen and pelvis 11/11/2011.  Findings:  Right Kidney:  Normal in size.  No hydronephrosis or renal mass.  Left Kidney:  Normal in size.  No hydronephrosis or renal mass.  Bladder:  Poorly visualized.  Unremarkable.  IMPRESSION: Negative exam.   Original Report Authenticated By: Davonna Belling, M.D.   1. Abdominal  pain, other specified site     MDM   Pt here about 1 week ago with lower abd pain and at time had a neg work up with U/S that r/o ovarian pathology but did have urine that could haves shown UTI and wet prep with BV.  She was given Macrobid and flagyl and completed 10 day course yesterday but pain persists and now intermittent right flank pain.  On exam pt is tender over the right flank and suprapubic region.  GC/Chlamydia cultures neg from last week and pt states last sexually active in may with fiance only. At this time low suspicion for pelvic pathology.  UPT neg here and UA today only with trace leukocytes.  No prior hx of PID or STDs. No peritoneal signs on exam and prior hx of cholecystectomy.  No N/V/D and not affected by food. Possible pyelo or kidney stone or possible hepatitis but not classic presentation. No symptoms suggestive of lung pathology.  Will get CBC, CMP to further eval.  Renal ultrasound pending.  If normal pt will f/u with PCP this week for recheck and further evaluation.  5:30 PM Labs and U/s neg.  At this time and discussing with pt do not feel that Ct is warrented.  Pt states her PCP is back in the office.  Will f/u next week and given strict  return precautions.  No sx to suggest appy, diverticulitis or pancreatitis.  Gwyneth Sprout, MD 06/02/13 1731

## 2013-08-01 ENCOUNTER — Encounter (HOSPITAL_BASED_OUTPATIENT_CLINIC_OR_DEPARTMENT_OTHER): Payer: Self-pay | Admitting: Emergency Medicine

## 2013-08-01 ENCOUNTER — Emergency Department (HOSPITAL_BASED_OUTPATIENT_CLINIC_OR_DEPARTMENT_OTHER)
Admission: EM | Admit: 2013-08-01 | Discharge: 2013-08-02 | Disposition: A | Discharge status: Home / Self Care | Payer: Medicaid Other | Attending: Emergency Medicine | Admitting: Emergency Medicine

## 2013-08-01 ENCOUNTER — Emergency Department (HOSPITAL_BASED_OUTPATIENT_CLINIC_OR_DEPARTMENT_OTHER): Payer: Medicaid Other

## 2013-08-01 DIAGNOSIS — Z880 Allergy status to penicillin: Secondary | ICD-10-CM | POA: Insufficient documentation

## 2013-08-01 DIAGNOSIS — Z8781 Personal history of (healed) traumatic fracture: Secondary | ICD-10-CM | POA: Insufficient documentation

## 2013-08-01 DIAGNOSIS — Y9389 Activity, other specified: Secondary | ICD-10-CM | POA: Insufficient documentation

## 2013-08-01 DIAGNOSIS — Z8742 Personal history of other diseases of the female genital tract: Secondary | ICD-10-CM | POA: Insufficient documentation

## 2013-08-01 DIAGNOSIS — F17200 Nicotine dependence, unspecified, uncomplicated: Secondary | ICD-10-CM | POA: Insufficient documentation

## 2013-08-01 DIAGNOSIS — F411 Generalized anxiety disorder: Secondary | ICD-10-CM | POA: Insufficient documentation

## 2013-08-01 DIAGNOSIS — E039 Hypothyroidism, unspecified: Secondary | ICD-10-CM | POA: Insufficient documentation

## 2013-08-01 DIAGNOSIS — Y929 Unspecified place or not applicable: Secondary | ICD-10-CM | POA: Insufficient documentation

## 2013-08-01 DIAGNOSIS — Z79899 Other long term (current) drug therapy: Secondary | ICD-10-CM | POA: Insufficient documentation

## 2013-08-01 DIAGNOSIS — Z8719 Personal history of other diseases of the digestive system: Secondary | ICD-10-CM | POA: Insufficient documentation

## 2013-08-01 DIAGNOSIS — Z8619 Personal history of other infectious and parasitic diseases: Secondary | ICD-10-CM | POA: Insufficient documentation

## 2013-08-01 DIAGNOSIS — W2209XA Striking against other stationary object, initial encounter: Secondary | ICD-10-CM | POA: Insufficient documentation

## 2013-08-01 DIAGNOSIS — Z7982 Long term (current) use of aspirin: Secondary | ICD-10-CM | POA: Insufficient documentation

## 2013-08-01 DIAGNOSIS — Z8249 Family history of ischemic heart disease and other diseases of the circulatory system: Secondary | ICD-10-CM | POA: Insufficient documentation

## 2013-08-01 DIAGNOSIS — Z862 Personal history of diseases of the blood and blood-forming organs and certain disorders involving the immune mechanism: Secondary | ICD-10-CM | POA: Insufficient documentation

## 2013-08-01 DIAGNOSIS — S6990XA Unspecified injury of unspecified wrist, hand and finger(s), initial encounter: Secondary | ICD-10-CM | POA: Insufficient documentation

## 2013-08-01 DIAGNOSIS — S6991XA Unspecified injury of right wrist, hand and finger(s), initial encounter: Secondary | ICD-10-CM

## 2013-08-01 DIAGNOSIS — Z86718 Personal history of other venous thrombosis and embolism: Secondary | ICD-10-CM | POA: Insufficient documentation

## 2013-08-01 HISTORY — DX: Hypothyroidism, unspecified: E03.9

## 2013-08-01 HISTORY — DX: Anxiety disorder, unspecified: F41.9
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
## 2013-08-01 NOTE — ED Notes (Signed)
Patient transported to X-ray 
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
## 2013-08-02 MED ORDER — HYDROCODONE-ACETAMINOPHEN 5-325 MG PO TABS: 1.0000 | ORAL_TABLET | ORAL | Status: DC | PRN

## 2013-08-02 NOTE — ED Provider Notes (Signed)
Medical screening examination/treatment/procedure(s) were performed by non-physician practitioner and as supervising physician I was immediately available for consultation/collaboration.  Glynn Octave, MD 08/02/13 [phone]

## 2013-08-02 NOTE — ED Notes (Signed)
+  PMS post splint application 

## 2014-03-10 ENCOUNTER — Emergency Department (HOSPITAL_BASED_OUTPATIENT_CLINIC_OR_DEPARTMENT_OTHER)
Admission: EM | Admit: 2014-03-10 | Discharge: 2014-03-10 | Disposition: A | Discharge status: Home / Self Care | Payer: Medicaid Other | Attending: Emergency Medicine | Admitting: Emergency Medicine

## 2014-03-10 ENCOUNTER — Encounter (HOSPITAL_BASED_OUTPATIENT_CLINIC_OR_DEPARTMENT_OTHER): Payer: Self-pay | Admitting: Emergency Medicine

## 2014-03-10 DIAGNOSIS — Z792 Long term (current) use of antibiotics: Secondary | ICD-10-CM | POA: Insufficient documentation

## 2014-03-10 DIAGNOSIS — Z8742 Personal history of other diseases of the female genital tract: Secondary | ICD-10-CM | POA: Insufficient documentation

## 2014-03-10 DIAGNOSIS — IMO0001 Reserved for inherently not codable concepts without codable children: Secondary | ICD-10-CM | POA: Insufficient documentation

## 2014-03-10 DIAGNOSIS — Z880 Allergy status to penicillin: Secondary | ICD-10-CM | POA: Insufficient documentation

## 2014-03-10 DIAGNOSIS — M791 Myalgia, unspecified site: Secondary | ICD-10-CM

## 2014-03-10 DIAGNOSIS — M129 Arthropathy, unspecified: Secondary | ICD-10-CM | POA: Insufficient documentation

## 2014-03-10 DIAGNOSIS — Z86718 Personal history of other venous thrombosis and embolism: Secondary | ICD-10-CM | POA: Insufficient documentation

## 2014-03-10 DIAGNOSIS — Z8619 Personal history of other infectious and parasitic diseases: Secondary | ICD-10-CM | POA: Insufficient documentation

## 2014-03-10 DIAGNOSIS — E039 Hypothyroidism, unspecified: Secondary | ICD-10-CM | POA: Insufficient documentation

## 2014-03-10 DIAGNOSIS — F17200 Nicotine dependence, unspecified, uncomplicated: Secondary | ICD-10-CM | POA: Insufficient documentation

## 2014-03-10 DIAGNOSIS — F411 Generalized anxiety disorder: Secondary | ICD-10-CM | POA: Insufficient documentation

## 2014-03-10 DIAGNOSIS — Z7901 Long term (current) use of anticoagulants: Secondary | ICD-10-CM | POA: Insufficient documentation

## 2014-03-10 DIAGNOSIS — Z7982 Long term (current) use of aspirin: Secondary | ICD-10-CM | POA: Insufficient documentation

## 2014-03-10 DIAGNOSIS — R197 Diarrhea, unspecified: Secondary | ICD-10-CM | POA: Insufficient documentation

## 2014-03-10 DIAGNOSIS — Z862 Personal history of diseases of the blood and blood-forming organs and certain disorders involving the immune mechanism: Secondary | ICD-10-CM | POA: Insufficient documentation

## 2014-03-10 DIAGNOSIS — Z791 Long term (current) use of non-steroidal anti-inflammatories (NSAID): Secondary | ICD-10-CM | POA: Insufficient documentation

## 2014-03-10 LAB — CBC WITH DIFFERENTIAL/PLATELET
BASOS PCT: 1 % (ref 0–1)
Basophils Absolute: 0.1 10*3/uL (ref 0.0–0.1)
EOS ABS: 0.8 10*3/uL — AB (ref 0.0–0.7)
EOS PCT: 8 % — AB (ref 0–5)
HCT: 37.4 % (ref 36.0–46.0)
Hemoglobin: 13 g/dL (ref 12.0–15.0)
LYMPHS ABS: 3.9 10*3/uL (ref 0.7–4.0)
Lymphocytes Relative: 42 % (ref 12–46)
MCH: 31.6 pg (ref 26.0–34.0)
MCHC: 34.8 g/dL (ref 30.0–36.0)
MCV: 91 fL (ref 78.0–100.0)
MONOS PCT: 8 % (ref 3–12)
Monocytes Absolute: 0.8 10*3/uL (ref 0.1–1.0)
NEUTROS PCT: 42 % — AB (ref 43–77)
Neutro Abs: 3.9 10*3/uL (ref 1.7–7.7)
PLATELETS: 275 10*3/uL (ref 150–400)
RBC: 4.11 MIL/uL (ref 3.87–5.11)
RDW: 13.1 % (ref 11.5–15.5)
WBC: 9.4 10*3/uL (ref 4.0–10.5)

## 2014-03-10 LAB — COMPREHENSIVE METABOLIC PANEL
ALBUMIN: 4.2 g/dL (ref 3.5–5.2)
ALT: 10 U/L (ref 0–35)
AST: 13 U/L (ref 0–37)
Alkaline Phosphatase: 106 U/L (ref 39–117)
BUN: 12 mg/dL (ref 6–23)
CHLORIDE: 105 meq/L (ref 96–112)
CO2: 27 meq/L (ref 19–32)
Calcium: 9.8 mg/dL (ref 8.4–10.5)
Creatinine, Ser: 1 mg/dL (ref 0.50–1.10)
GFR calc Af Amer: 88 mL/min — ABNORMAL LOW (ref >90)
GFR, EST NON AFRICAN AMERICAN: 76 mL/min — AB (ref >90)
Glucose, Bld: 95 mg/dL (ref 70–99)
POTASSIUM: 4.8 meq/L (ref 3.7–5.3)
SODIUM: 145 meq/L (ref 137–147)
Total Protein: 7.2 g/dL (ref 6.0–8.3)

## 2014-03-10 MED ORDER — HYDROCODONE-ACETAMINOPHEN 5-325 MG PO TABS
1.0000 | ORAL_TABLET | Freq: Once | ORAL | Status: DC
  Filled 2014-03-10: qty 1

## 2014-03-10 MED ORDER — NAPROXEN 250 MG PO TABS
500.0000 mg | ORAL_TABLET | Freq: Once | ORAL | Status: AC
  Administered 2014-03-10: 500 mg via ORAL
  Filled 2014-03-10 (×2): qty 2

## 2014-03-10 MED ORDER — HYDROCODONE-ACETAMINOPHEN 5-325 MG PO TABS: 1.0000 | ORAL_TABLET | Freq: Four times a day (QID) | ORAL | Status: DC | PRN

## 2014-03-10 MED ORDER — NAPROXEN 500 MG PO TABS: 500.0000 mg | ORAL_TABLET | Freq: Two times a day (BID) | ORAL | Status: DC

## 2014-03-10 NOTE — ED Notes (Signed)
Pt reports intermittent extremity and neck "aches" x 1 week.

## 2014-03-10 NOTE — ED Provider Notes (Signed)
CSN: [id number]     Arrival date & time 03/10/14  1426 History  This chart was scribed for Shelda Jakes, MD by Blanchard Kelch, ED Scribe. The patient was seen in room MH02/MH02. Patient's care was started at 6:00 PM.     Chief Complaint  Patient presents with  . Generalized Body Aches      Patient is a 28 y.o. female presenting with musculoskeletal pain. The history is provided by the patient. No language interpreter was used.  Muscle Pain This is a new problem. The current episode started more than 2 days ago. The problem occurs daily. The problem has not changed since onset.Associated symptoms include headaches. Pertinent negatives include no chest pain, no abdominal pain and no shortness of breath. Nothing relieves the symptoms. She has tried a warm compress, a cold compress and acetaminophen for the symptoms. The treatment provided no relief.    HPI Comments: Barbara Moses is a 28 y.o. female who presents to the Emergency Department complaining of intermittent episodes of pain to the top of her legs, back of her knees, arms, neck and flank that began about a week ago. The pain affects different pasts of her body at different times and the episodes last about twenty minutes before subsiding. She describes the pain as aching and a squeezing sensation. She also reports a sensation of swallowing an ice cube with the pain in her flank. She is currently hurting in her elbows bilaterally. She rates the pain as a 6.5/10 in severity. She has tried acetaminophen, BC, naproxen and hot and cold compresses without relief. She denies using one medication over the course of a few days for the pain. She also reports headaches daily and non-bloody diarrhea for three days. She denies fever, cough, rhinorrhea, sore throat, visual changes, chest pain, shortness of breath, abdominal pain, nausea, vomiting, dysuria, leg swelling, joint swelling, back pain, history of bleeding easily or rash. She reports a past  medical history of arthritis in her neck and hypothyroidism. She is on Synthroid for her hypothyroidism.    PCP is at [facility] in [facility]. She has an appointment on 4/6.  Past Medical History  Diagnosis Date  . Gallstones   . Cervix, short (affecting pregnancy) 03/28/2011  . Anemia   . Family history of DVT 12/17/2011  . Thyroid condition   . BV (bacterial vaginosis)   . DVT (deep venous thrombosis)   . Hypothyroidism   . Anxiety    Past Surgical History  Procedure Laterality Date  . Cholecystectomy    . Wisdom tooth extraction    . Cholecystectomy, laparoscopic     Family History  Problem Relation Age of Onset  . Fibroids Mother   . Diabetes Father   . Crohn's disease Father   . Cancer Maternal Grandmother    History  Substance Use Topics  . Smoking status: Current Every Day Smoker -- 0.50 packs/day for 7 years    Types: Cigarettes  . Smokeless tobacco: Current User  . Alcohol Use: No   OB History   Grav Para Term Preterm Abortions TAB SAB Ect Mult Living   2 2 1 1  0 0 0 0 0 2     Review of Systems  Constitutional: Negative for fever and chills.  HENT: Negative for rhinorrhea.   Eyes: Negative for visual disturbance.  Respiratory: Negative for cough and shortness of breath.   Cardiovascular: Negative for chest pain and leg swelling.  Gastrointestinal: Positive for diarrhea. Negative for  nausea, vomiting and abdominal pain.  Genitourinary: Positive for flank pain. Negative for dysuria.  Musculoskeletal: Positive for arthralgias, myalgias and neck pain. Negative for back pain and joint swelling.  Skin: Negative for rash.  Neurological: Positive for headaches.  Hematological: Does not bruise/bleed easily.  Psychiatric/Behavioral: Negative for confusion.      Allergies  Penicillins and Tramadol  Home Medications   Current Outpatient Rx  Name  Route  Sig  Dispense  Refill  . acetaminophen (TYLENOL) 500 MG tablet   Oral   Take 1,500 mg by  mouth every 6 (six) hours as needed. For pain         . aspirin 81 MG tablet   Oral   Take 81 mg by mouth daily.         . Brompheniramine-Phenylephrine (COLD & ALLERGY PO)   Oral   Take 2 tablets by mouth daily as needed. For cold symptms         . DULoxetine (CYMBALTA) 30 MG capsule   Oral   Take 30 mg by mouth daily.         Marland Kitchen. EXPIRED: enoxaparin (LOVENOX) 150 MG/ML injection   Subcutaneous   Inject 0.69 mLs (105 mg total) into the skin daily.   25 mL   1   . fexofenadine-pseudoephedrine (ALLEGRA-D 24 HOUR) 180-240 MG per 24 hr tablet   Oral   Take 1 tablet by mouth daily.   10 tablet   0   . HYDROcodone-acetaminophen (NORCO/VICODIN) 5-325 MG per tablet   Oral   Take 2 tablets by mouth every 4 (four) hours as needed.   20 tablet   0   . HYDROcodone-acetaminophen (NORCO/VICODIN) 5-325 MG per tablet   Oral   Take 1 tablet by mouth every 6 (six) hours as needed for pain.   15 tablet   0   . HYDROcodone-acetaminophen (NORCO/VICODIN) 5-325 MG per tablet   Oral   Take 1 tablet by mouth every 4 (four) hours as needed for pain.   10 tablet   0   . HYDROcodone-acetaminophen (NORCO/VICODIN) 5-325 MG per tablet   Oral   Take 1-2 tablets by mouth every 6 (six) hours as needed for moderate pain.   20 tablet   0   . ibuprofen (ADVIL,MOTRIN) 800 MG tablet   Oral   Take 1 tablet (800 mg total) by mouth 3 (three) times daily.   21 tablet   0   . levothyroxine (SYNTHROID, LEVOTHROID) 50 MCG tablet   Oral   Take 50 mcg by mouth daily.         . metroNIDAZOLE (FLAGYL) 500 MG tablet   Oral   Take 1 tablet (500 mg total) by mouth 2 (two) times daily.   14 tablet   0   . naproxen (NAPROSYN) 500 MG tablet   Oral   Take 1 tablet (500 mg total) by mouth 2 (two) times daily.   14 tablet   1   . oxymetazoline (AFRIN NASAL SPRAY) 0.05 % nasal spray   Nasal   Place 2 sprays into the nose 2 (two) times daily.   15 mL   0     For 3 days   .  pseudoephedrine-acetaminophen (TYLENOL SINUS) 30-500 MG TABS   Oral   Take 1 tablet by mouth every 4 (four) hours as needed.          Triage Vitals: BP 107/73  Pulse 115  Temp(Src) 98.4 F (36.9 C) (Oral)  Resp  16  Ht 5\' 2"  (1.575 m)  Wt 135 lb (61.236 kg)  BMI 24.69 kg/m2  SpO2 100%  LMP 02/19/2014  Physical Exam  Nursing note and vitals reviewed. Constitutional: She is oriented to person, place, and time.  HENT:  Mouth/Throat: Mucous membranes are normal.  Eyes: Right eye exhibits no discharge. Left eye exhibits no discharge. No scleral icterus.  Neck: No thyromegaly present.  Cardiovascular: Normal rate and regular rhythm.   Pulmonary/Chest: Effort normal and breath sounds normal. No respiratory distress. She has no wheezes.  Abdominal: Soft. Bowel sounds are normal. There is no tenderness.  Musculoskeletal:  No ankle swelling.   Neurological: She is alert and oriented to person, place, and time. No cranial nerve deficit.  Skin: Skin is warm and dry. No rash noted.    ED Course  Procedures (including critical care time)  DIAGNOSTIC STUDIES: Oxygen Saturation is 100% on room air, normal by my interpretation.    COORDINATION OF CARE: 6:32 PM -Will order CMP, CBC with differential and Naprosyn. Patient verbalizes understanding and agrees with treatment plan.  Medications  HYDROcodone-acetaminophen (NORCO/VICODIN) 5-325 MG per tablet 1 tablet (not administered)  naproxen (NAPROSYN) tablet 500 mg (500 mg Oral Given 03/10/14 1835)   Results for orders placed during the hospital encounter of 03/10/14  COMPREHENSIVE METABOLIC PANEL      Result Value Ref Range   Sodium 145  137 - 147 mEq/L   Potassium 4.8  3.7 - 5.3 mEq/L   Chloride 105  96 - 112 mEq/L   CO2 27  19 - 32 mEq/L   Glucose, Bld 95  70 - 99 mg/dL   BUN 12  6 - 23 mg/dL   Creatinine, Ser 1.19  0.50 - 1.10 mg/dL   Calcium 9.8  8.4 - 14.7 mg/dL   Total Protein 7.2  6.0 - 8.3 g/dL   Albumin 4.2  3.5 - 5.2  g/dL   AST 13  0 - 37 U/L   ALT 10  0 - 35 U/L   Alkaline Phosphatase 106  39 - 117 U/L   Total Bilirubin <0.2 (*) 0.3 - 1.2 mg/dL   GFR calc non Af Amer 76 (*) >90 mL/min   GFR calc Af Amer 88 (*) >90 mL/min  CBC WITH DIFFERENTIAL      Result Value Ref Range   WBC 9.4  4.0 - 10.5 K/uL   RBC 4.11  3.87 - 5.11 MIL/uL   Hemoglobin 13.0  12.0 - 15.0 g/dL   HCT 82.9  56.2 - 13.0 %   MCV 91.0  78.0 - 100.0 fL   MCH 31.6  26.0 - 34.0 pg   MCHC 34.8  30.0 - 36.0 g/dL   RDW 86.5  78.4 - 69.6 %   Platelets 275  150 - 400 K/uL   Neutrophils Relative % 42 (*) 43 - 77 %   Neutro Abs 3.9  1.7 - 7.7 K/uL   Lymphocytes Relative 42  12 - 46 %   Lymphs Abs 3.9  0.7 - 4.0 K/uL   Monocytes Relative 8  3 - 12 %   Monocytes Absolute 0.8  0.1 - 1.0 K/uL   Eosinophils Relative 8 (*) 0 - 5 %   Eosinophils Absolute 0.8 (*) 0.0 - 0.7 K/uL   Basophils Relative 1  0 - 1 %   Basophils Absolute 0.1  0.0 - 0.1 K/uL      EKG Interpretation None      MDM   Final  diagnoses:  Myalgia    Symptoms here seem to be consistent with a myalgia perhaps a polyarthritis. Patient will need a follow primary care Dr. for additional workup and a half thyroid studies redone. Basic labs here without any significant abnormalities. Will treat with Naprosyn on a regular basis to supplement with hydrocodone. Patient was last seen here in August.    I personally performed the services described in this documentation, which was scribed in my presence. The recorded information has been reviewed and is accurate.     Shelda Jakes, MD 03/10/14 [phone]

## 2014-03-10 NOTE — Discharge Instructions (Signed)
Basic labs urine negative. Followup with your primary care Dr. for additional workup for thyroid problems and arthritis and/or connective tissue disorders will be important. Take pain medicine as directed take the Naprosyn on a regular basis.

## 2014-03-10 NOTE — ED Notes (Signed)
Pt has no ride home, no meds given per policy.

## 2014-05-24 ENCOUNTER — Emergency Department (HOSPITAL_BASED_OUTPATIENT_CLINIC_OR_DEPARTMENT_OTHER)
Admission: EM | Admit: 2014-05-24 | Discharge: 2014-05-25 | Disposition: A | Discharge status: Home / Self Care | Payer: Medicaid Other | Attending: Emergency Medicine | Admitting: Emergency Medicine

## 2014-05-24 ENCOUNTER — Encounter (HOSPITAL_BASED_OUTPATIENT_CLINIC_OR_DEPARTMENT_OTHER): Payer: Self-pay | Admitting: Emergency Medicine

## 2014-05-24 DIAGNOSIS — Z7901 Long term (current) use of anticoagulants: Secondary | ICD-10-CM | POA: Insufficient documentation

## 2014-05-24 DIAGNOSIS — F17200 Nicotine dependence, unspecified, uncomplicated: Secondary | ICD-10-CM | POA: Insufficient documentation

## 2014-05-24 DIAGNOSIS — F411 Generalized anxiety disorder: Secondary | ICD-10-CM | POA: Insufficient documentation

## 2014-05-24 DIAGNOSIS — R05 Cough: Secondary | ICD-10-CM | POA: Insufficient documentation

## 2014-05-24 DIAGNOSIS — E039 Hypothyroidism, unspecified: Secondary | ICD-10-CM | POA: Insufficient documentation

## 2014-05-24 DIAGNOSIS — L259 Unspecified contact dermatitis, unspecified cause: Secondary | ICD-10-CM | POA: Insufficient documentation

## 2014-05-24 DIAGNOSIS — Z8619 Personal history of other infectious and parasitic diseases: Secondary | ICD-10-CM | POA: Insufficient documentation

## 2014-05-24 DIAGNOSIS — Z880 Allergy status to penicillin: Secondary | ICD-10-CM | POA: Insufficient documentation

## 2014-05-24 DIAGNOSIS — J3489 Other specified disorders of nose and nasal sinuses: Secondary | ICD-10-CM | POA: Insufficient documentation

## 2014-05-24 DIAGNOSIS — Z8719 Personal history of other diseases of the digestive system: Secondary | ICD-10-CM | POA: Insufficient documentation

## 2014-05-24 DIAGNOSIS — Z86718 Personal history of other venous thrombosis and embolism: Secondary | ICD-10-CM | POA: Insufficient documentation

## 2014-05-24 DIAGNOSIS — Z8742 Personal history of other diseases of the female genital tract: Secondary | ICD-10-CM | POA: Insufficient documentation

## 2014-05-24 DIAGNOSIS — Z791 Long term (current) use of non-steroidal anti-inflammatories (NSAID): Secondary | ICD-10-CM | POA: Insufficient documentation

## 2014-05-24 DIAGNOSIS — Z7982 Long term (current) use of aspirin: Secondary | ICD-10-CM | POA: Insufficient documentation

## 2014-05-24 DIAGNOSIS — Z862 Personal history of diseases of the blood and blood-forming organs and certain disorders involving the immune mechanism: Secondary | ICD-10-CM | POA: Insufficient documentation

## 2014-05-24 DIAGNOSIS — R059 Cough, unspecified: Secondary | ICD-10-CM | POA: Insufficient documentation

## 2014-05-24 MED ORDER — HYDROCORTISONE 1 % EX CREA: TOPICAL_CREAM | CUTANEOUS | Status: DC

## 2014-05-24 NOTE — ED Notes (Signed)
Pt c/o rash on arm x 3 days. Pt reports picking up a stray dog that had mange x 2 weeks ago. Pt also c/o cough and chest congestion x 3 days.

## 2014-05-24 NOTE — ED Provider Notes (Signed)
CSN: [id number]     Arrival date & time 05/24/14  2309 History  This chart was scribed for Barbara Smitty CordsK Palumbo-Rasch, MD by Nicholos Johnsenise Iheanachor, ED scribe. This patient was seen in room MH11/MH11 and the patient's care was started at 11:48 PM.    Chief Complaint  Patient presents with  . Rash    Patient is a 28 y.o. female presenting with rash. The history is provided by the patient. No language interpreter was used.  Rash Location:  Shoulder/arm Shoulder/arm rash location:  L arm and R arm Quality: itchiness and redness   Severity:  Mild Onset quality:  Gradual Duration:  3 days Timing:  Constant Progression:  Spreading Chronicity:  New Context: animal contact   Relieved by:  Nothing Worsened by:  Nothing tried Ineffective treatments:  None tried Associated symptoms: no abdominal pain and not wheezing   HPI Comments: Barbara Moses is a 28 y.o. female who presents to the Emergency Department with an itchy red, rash on bilateral arms; onset 3 days ago. States she picked up a stray dog 2 weeks ago that had mange and brought home. Has since gotten rid of the dog. Has not been taking anything to treat. Also reports cough and congestion for the past 3 days. Treating with OTC medication with minimal relief.  Past Medical History  Diagnosis Date  . Gallstones   . Cervix, short (affecting pregnancy) 03/28/2011  . Anemia   . Family history of DVT 12/17/2011  . Thyroid condition   . BV (bacterial vaginosis)   . DVT (deep venous thrombosis)   . Hypothyroidism   . Anxiety    Past Surgical History  Procedure Laterality Date  . Cholecystectomy    . Wisdom tooth extraction    . Cholecystectomy, laparoscopic     Family History  Problem Relation Age of Onset  . Fibroids Mother   . Diabetes Father   . Crohn's disease Father   . Cancer Maternal Grandmother    History  Substance Use Topics  . Smoking status: Current Every Day Smoker -- 0.50 packs/day for 7 years    Types: Cigarettes  .  Smokeless tobacco: Current User  . Alcohol Use: No   OB History   Grav Para Term Preterm Abortions TAB SAB Ect Mult Living   2 2 1 1  0 0 0 0 0 2     Review of Systems  HENT: Positive for congestion.   Respiratory: Positive for cough. Negative for wheezing.   Gastrointestinal: Negative for abdominal pain.  Skin: Positive for rash.  All other systems reviewed and are negative.  Allergies  Penicillins and Tramadol  Home Medications   Prior to Admission medications   Medication Sig Start Date End Date Taking? Authorizing Bartosz Luginbill  acetaminophen (TYLENOL) 500 MG tablet Take 1,500 mg by mouth every 6 (six) hours as needed. For pain    Historical Joyanne Eddinger, MD  aspirin 81 MG tablet Take 81 mg by mouth daily.    Historical Bri Wakeman, MD  Brompheniramine-Phenylephrine (COLD & ALLERGY PO) Take 2 tablets by mouth daily as needed. For cold symptms    Historical Aamori Mcmasters, MD  DULoxetine (CYMBALTA) 30 MG capsule Take 30 mg by mouth daily.    Historical Brett Soza, MD  enoxaparin (LOVENOX) 150 MG/ML injection Inject 0.69 mLs (105 mg total) into the skin daily. 09/03/11 09/10/11  Peggy Constant, MD  fexofenadine-pseudoephedrine (ALLEGRA-D 24 HOUR) 180-240 MG per 24 hr tablet Take 1 tablet by mouth daily. 10/24/12   Raeford RazorStephen Kohut, MD  HYDROcodone-acetaminophen (NORCO/VICODIN) 5-325 MG per tablet Take 2 tablets by mouth every 4 (four) hours as needed. 05/24/13   Elson AreasLeslie K Sofia, PA-C  HYDROcodone-acetaminophen (NORCO/VICODIN) 5-325 MG per tablet Take 1 tablet by mouth every 6 (six) hours as needed for pain. 06/02/13   Gwyneth SproutWhitney Plunkett, MD  HYDROcodone-acetaminophen (NORCO/VICODIN) 5-325 MG per tablet Take 1 tablet by mouth every 4 (four) hours as needed for pain. 08/02/13   Garlon HatchetLisa M Sanders, PA-C  HYDROcodone-acetaminophen (NORCO/VICODIN) 5-325 MG per tablet Take 1-2 tablets by mouth every 6 (six) hours as needed for moderate pain. 03/10/14   Vanetta MuldersScott Zackowski, MD  ibuprofen (ADVIL,MOTRIN) 800 MG tablet Take 1 tablet  (800 mg total) by mouth 3 (three) times daily. 05/24/13   Elson AreasLeslie K Sofia, PA-C  levothyroxine (SYNTHROID, LEVOTHROID) 50 MCG tablet Take 50 mcg by mouth daily.    Historical Zigmond Trela, MD  metroNIDAZOLE (FLAGYL) 500 MG tablet Take 1 tablet (500 mg total) by mouth 2 (two) times daily. 05/24/13   Elson AreasLeslie K Sofia, PA-C  naproxen (NAPROSYN) 500 MG tablet Take 1 tablet (500 mg total) by mouth 2 (two) times daily. 03/10/14   Vanetta MuldersScott Zackowski, MD  oxymetazoline (AFRIN NASAL SPRAY) 0.05 % nasal spray Place 2 sprays into the nose 2 (two) times daily. 10/24/12   Raeford RazorStephen Kohut, MD  pseudoephedrine-acetaminophen (TYLENOL SINUS) 30-500 MG TABS Take 1 tablet by mouth every 4 (four) hours as needed.    Historical Breland Trouten, MD   Triage vitals: BP 99/70  Pulse 68  Temp(Src) 98.2 F (36.8 C) (Oral)  Resp 18  Ht 5\' 2"  (1.575 m)  SpO2 98%  Physical Exam  Nursing note and vitals reviewed. Constitutional: She is oriented to person, place, and time. She appears well-developed and well-nourished. No distress.  HENT:  Head: Normocephalic and atraumatic.  Mouth/Throat: Oropharynx is clear and moist.  No swelling of the lips, tongue, or uvula.  Eyes: Conjunctivae and EOM are normal. Pupils are equal, round, and reactive to light.  Neck: Normal range of motion. Neck supple. No tracheal deviation present.  Cardiovascular: Normal rate, regular rhythm and normal heart sounds.   No murmur heard. Pulmonary/Chest: Effort normal and breath sounds normal. No respiratory distress. She has no wheezes. She has no rhonchi. She has no rales.  Abdominal: Soft. Bowel sounds are normal. There is no tenderness. There is no rebound and no guarding.  Musculoskeletal: Normal range of motion.  Neurological: She is alert and oriented to person, place, and time.  Skin: Skin is warm and dry.  2 macules on left elbow.  Psychiatric: She has a normal mood and affect. Her behavior is normal.    ED Course  Procedures (including critical care  time) DIAGNOSTIC STUDIES: Oxygen Saturation is 98% on room air, normal by my interpretation.    COORDINATION OF CARE: At 11:50 PM: Discussed treatment plan with patient which includes treatment of rash with hydrocortisone cream. Patient agrees.    Labs Review Labs Reviewed - No data to display  Imaging Review No results found.   EKG Interpretation None      MDM   Final diagnoses:  None   Hydrocortisone cream and following up with your family doctor for recheck I personally performed the services described in this documentation, which was scribed in my presence. The recorded information has been reviewed and is accurate.     Jasmine AweApril K Palumbo-Rasch, MD 05/25/14 [phone]

## 2014-09-29 ENCOUNTER — Emergency Department (HOSPITAL_BASED_OUTPATIENT_CLINIC_OR_DEPARTMENT_OTHER)
Admission: EM | Admit: 2014-09-29 | Discharge: 2014-09-29 | Disposition: A | Discharge status: Home / Self Care | Payer: Medicaid Other | Attending: Emergency Medicine | Admitting: Emergency Medicine

## 2014-09-29 ENCOUNTER — Encounter (HOSPITAL_BASED_OUTPATIENT_CLINIC_OR_DEPARTMENT_OTHER): Payer: Self-pay | Admitting: Emergency Medicine

## 2014-09-29 DIAGNOSIS — Z3202 Encounter for pregnancy test, result negative: Secondary | ICD-10-CM | POA: Diagnosis not present

## 2014-09-29 DIAGNOSIS — Z7982 Long term (current) use of aspirin: Secondary | ICD-10-CM | POA: Diagnosis not present

## 2014-09-29 DIAGNOSIS — M545 Low back pain: Secondary | ICD-10-CM

## 2014-09-29 DIAGNOSIS — R300 Dysuria: Secondary | ICD-10-CM | POA: Diagnosis not present

## 2014-09-29 DIAGNOSIS — M549 Dorsalgia, unspecified: Secondary | ICD-10-CM | POA: Diagnosis present

## 2014-09-29 DIAGNOSIS — F419 Anxiety disorder, unspecified: Secondary | ICD-10-CM | POA: Diagnosis not present

## 2014-09-29 DIAGNOSIS — Z8719 Personal history of other diseases of the digestive system: Secondary | ICD-10-CM | POA: Diagnosis not present

## 2014-09-29 DIAGNOSIS — Z8742 Personal history of other diseases of the female genital tract: Secondary | ICD-10-CM | POA: Insufficient documentation

## 2014-09-29 DIAGNOSIS — Z7952 Long term (current) use of systemic steroids: Secondary | ICD-10-CM | POA: Diagnosis not present

## 2014-09-29 DIAGNOSIS — Z791 Long term (current) use of non-steroidal anti-inflammatories (NSAID): Secondary | ICD-10-CM | POA: Diagnosis not present

## 2014-09-29 DIAGNOSIS — Z720 Tobacco use: Secondary | ICD-10-CM | POA: Insufficient documentation

## 2014-09-29 DIAGNOSIS — Z880 Allergy status to penicillin: Secondary | ICD-10-CM | POA: Diagnosis not present

## 2014-09-29 DIAGNOSIS — Z8619 Personal history of other infectious and parasitic diseases: Secondary | ICD-10-CM | POA: Diagnosis not present

## 2014-09-29 DIAGNOSIS — Z79899 Other long term (current) drug therapy: Secondary | ICD-10-CM | POA: Diagnosis not present

## 2014-09-29 DIAGNOSIS — Z7901 Long term (current) use of anticoagulants: Secondary | ICD-10-CM | POA: Diagnosis not present

## 2014-09-29 DIAGNOSIS — Z862 Personal history of diseases of the blood and blood-forming organs and certain disorders involving the immune mechanism: Secondary | ICD-10-CM | POA: Diagnosis not present

## 2014-09-29 DIAGNOSIS — E039 Hypothyroidism, unspecified: Secondary | ICD-10-CM | POA: Diagnosis not present

## 2014-09-29 DIAGNOSIS — Z86718 Personal history of other venous thrombosis and embolism: Secondary | ICD-10-CM | POA: Insufficient documentation

## 2014-09-29 LAB — URINALYSIS, ROUTINE W REFLEX MICROSCOPIC
Bilirubin Urine: NEGATIVE
GLUCOSE, UA: NEGATIVE mg/dL
Hgb urine dipstick: NEGATIVE
KETONES UR: NEGATIVE mg/dL
Nitrite: NEGATIVE
PROTEIN: 30 mg/dL — AB
Specific Gravity, Urine: 1.017 (ref 1.005–1.030)
Urobilinogen, UA: 0.2 mg/dL (ref 0.0–1.0)
pH: 6.5 (ref 5.0–8.0)

## 2014-09-29 LAB — PREGNANCY, URINE: Preg Test, Ur: NEGATIVE

## 2014-09-29 LAB — URINE MICROSCOPIC-ADD ON

## 2014-09-29 MED ORDER — CIPROFLOXACIN HCL 500 MG PO TABS: 500.0000 mg | ORAL_TABLET | Freq: Two times a day (BID) | ORAL | Status: DC

## 2014-09-29 MED ORDER — OXYCODONE-ACETAMINOPHEN 5-325 MG PO TABS
1.0000 | ORAL_TABLET | Freq: Once | ORAL | Status: AC
  Administered 2014-09-29: 1 via ORAL
  Filled 2014-09-29: qty 1

## 2014-09-29 NOTE — ED Notes (Signed)
Pt. Reports she has had low back pain and lower abd. Pain with burning upon urination for 2 days

## 2014-09-29 NOTE — ED Provider Notes (Signed)
CSN: [id number]     Arrival date & time 09/29/14  1616 History   First MD Initiated Contact with Patient 09/29/14 1622     Chief Complaint  Patient presents with  . Back Pain     (Consider location/radiation/quality/duration/timing/severity/associated sxs/prior Treatment) Patient is a 28 y.o. female presenting with back pain. The history is provided by the patient.  Back Pain Location:  Lumbar spine Quality:  Aching Radiates to:  Does not radiate Pain severity:  Mild Pain is:  Same all the time Onset quality:  Gradual Duration:  3 days Timing:  Constant Progression:  Unchanged Chronicity:  New Context comment:  Urinary sx Relieved by:  Nothing Worsened by:  Nothing tried Ineffective treatments:  None tried Associated symptoms: dysuria   Associated symptoms: no abdominal pain, no chest pain, no fever and no headaches   Dysuria:    Severity:  Mild   Onset quality:  Gradual   Duration:  3 days   Timing:  Constant   Progression:  Worsening   Chronicity:  New   Past Medical History  Diagnosis Date  . Gallstones   . Cervix, short (affecting pregnancy) 03/28/2011  . Anemia   . Family history of DVT 12/17/2011  . Thyroid condition   . BV (bacterial vaginosis)   . DVT (deep venous thrombosis)   . Hypothyroidism   . Anxiety    Past Surgical History  Procedure Laterality Date  . Cholecystectomy    . Wisdom tooth extraction    . Cholecystectomy, laparoscopic     Family History  Problem Relation Age of Onset  . Fibroids Mother   . Diabetes Father   . Crohn's disease Father   . Cancer Maternal Grandmother    History  Substance Use Topics  . Smoking status: Current Every Day Smoker -- 0.50 packs/day for 7 years    Types: Cigarettes  . Smokeless tobacco: Current User  . Alcohol Use: No   OB History   Grav Para Term Preterm Abortions TAB SAB Ect Mult Living   2 2 1 1  0 0 0 0 0 2     Review of Systems  Constitutional: Negative for fever and fatigue.  HENT:  Negative for congestion and drooling.   Eyes: Negative for pain.  Respiratory: Negative for cough and shortness of breath.   Cardiovascular: Negative for chest pain.  Gastrointestinal: Negative for nausea, vomiting, abdominal pain and diarrhea.  Genitourinary: Positive for dysuria. Negative for hematuria.  Musculoskeletal: Positive for back pain. Negative for gait problem and neck pain.  Skin: Negative for color change.  Neurological: Negative for dizziness and headaches.  Hematological: Negative for adenopathy.  Psychiatric/Behavioral: Negative for behavioral problems.  All other systems reviewed and are negative.     Allergies  Penicillins and Tramadol  Home Medications   Prior to Admission medications   Medication Sig Start Date End Date Taking? Authorizing Provider  acetaminophen (TYLENOL) 500 MG tablet Take 1,500 mg by mouth every 6 (six) hours as needed. For pain    Historical Provider, MD  aspirin 81 MG tablet Take 81 mg by mouth daily.    Historical Provider, MD  Brompheniramine-Phenylephrine (COLD & ALLERGY PO) Take 2 tablets by mouth daily as needed. For cold symptms    Historical Provider, MD  DULoxetine (CYMBALTA) 30 MG capsule Take 30 mg by mouth daily.    Historical Provider, MD  enoxaparin (LOVENOX) 150 MG/ML injection Inject 0.69 mLs (105 mg total) into the skin daily. 09/03/11 09/10/11  Peggy Constant,  MD  fexofenadine-pseudoephedrine (ALLEGRA-D 24 HOUR) 180-240 MG per 24 hr tablet Take 1 tablet by mouth daily. 10/24/12   Raeford Razor, MD  HYDROcodone-acetaminophen (NORCO/VICODIN) 5-325 MG per tablet Take 2 tablets by mouth every 4 (four) hours as needed. 05/24/13   Elson Areas, PA-C  HYDROcodone-acetaminophen (NORCO/VICODIN) 5-325 MG per tablet Take 1 tablet by mouth every 6 (six) hours as needed for pain. 06/02/13   Gwyneth Sprout, MD  HYDROcodone-acetaminophen (NORCO/VICODIN) 5-325 MG per tablet Take 1 tablet by mouth every 4 (four) hours as needed for pain.  08/02/13   Garlon Hatchet, PA-C  HYDROcodone-acetaminophen (NORCO/VICODIN) 5-325 MG per tablet Take 1-2 tablets by mouth every 6 (six) hours as needed for moderate pain. 03/10/14   Vanetta Mulders, MD  hydrocortisone cream 1 % Apply to affected area 2 times daily 05/24/14   April K Palumbo-Rasch, MD  ibuprofen (ADVIL,MOTRIN) 800 MG tablet Take 1 tablet (800 mg total) by mouth 3 (three) times daily. 05/24/13   Elson Areas, PA-C  levothyroxine (SYNTHROID, LEVOTHROID) 50 MCG tablet Take 50 mcg by mouth daily.    Historical Provider, MD  metroNIDAZOLE (FLAGYL) 500 MG tablet Take 1 tablet (500 mg total) by mouth 2 (two) times daily. 05/24/13   Elson Areas, PA-C  naproxen (NAPROSYN) 500 MG tablet Take 1 tablet (500 mg total) by mouth 2 (two) times daily. 03/10/14   Vanetta Mulders, MD  oxymetazoline (AFRIN NASAL SPRAY) 0.05 % nasal spray Place 2 sprays into the nose 2 (two) times daily. 10/24/12   Raeford Razor, MD  pseudoephedrine-acetaminophen (TYLENOL SINUS) 30-500 MG TABS Take 1 tablet by mouth every 4 (four) hours as needed.    Historical Provider, MD   BP 107/76  Pulse 75  Temp(Src) 98.8 F (37.1 C) (Oral)  Resp 18  Ht 5\' 2"  (1.575 m)  Wt 130 lb (58.968 kg)  BMI 23.77 kg/m2  SpO2 100% Physical Exam  Nursing note and vitals reviewed. Constitutional: She is oriented to person, place, and time. She appears well-developed and well-nourished.  HENT:  Head: Normocephalic.  Mouth/Throat: Oropharynx is clear and moist. No oropharyngeal exudate.  Eyes: Conjunctivae and EOM are normal. Pupils are equal, round, and reactive to light.  Neck: Normal range of motion. Neck supple.  Cardiovascular: Normal rate, regular rhythm, normal heart sounds and intact distal pulses.  Exam reveals no gallop and no friction rub.   No murmur heard. Pulmonary/Chest: Effort normal and breath sounds normal. No respiratory distress. She has no wheezes.  Abdominal: Soft. Bowel sounds are normal. There is no tenderness.  There is no rebound and no guarding.  Musculoskeletal: Normal range of motion. She exhibits no edema and no tenderness.  No CVA ttp bilaterally.   Neurological: She is alert and oriented to person, place, and time.  Skin: Skin is warm and dry.  Psychiatric: She has a normal mood and affect. Her behavior is normal.    ED Course  Procedures (including critical care time) Labs Review Labs Reviewed  URINALYSIS, ROUTINE W REFLEX MICROSCOPIC - Abnormal; Notable for the following:    APPearance CLOUDY (*)    Protein, ur 30 (*)    Leukocytes, UA SMALL (*)    All other components within normal limits  URINE MICROSCOPIC-ADD ON - Abnormal; Notable for the following:    Squamous Epithelial / LPF FEW (*)    Bacteria, UA FEW (*)    All other components within normal limits  URINE CULTURE  PREGNANCY, URINE    Imaging Review  No results found.   EKG Interpretation None      MDM   Final diagnoses:  Dysuria  Low back pain, unspecified back pain laterality, with sciatica presence unspecified    4:45 PM 28 y.o. female who presents with dysuria and low back pain which has developed over the last 2-3 days. She denies any fevers or vomiting. She is afebrile and vital signs are unremarkable here. She states that her symptoms are consistent with previous UTIs. Will give a percocet for back pain here, she states she is not driving.   Urine has small leukocytes, and few bacteria. Given symptoms will treat with Cipro and also send a culture. Cx has grown out e. Coli in the past.   4:47 PM:  I have discussed the diagnosis/risks/treatment options with the patient and believe the pt to be eligible for discharge home to follow-up with her pcp as needed. We also discussed returning to the ED immediately if new or worsening sx occur. We discussed the sx which are most concerning (e.g., worsening back pain, fever, inability to take abx) that necessitate immediate return. Medications administered to the  patient during their visit and any new prescriptions provided to the patient are listed below.  Medications given during this visit Medications  oxyCODONE-acetaminophen (PERCOCET/ROXICET) 5-325 MG per tablet 1 tablet (1 tablet Oral Given 09/29/14 1643)    New Prescriptions   CIPROFLOXACIN (CIPRO) 500 MG TABLET    Take 1 tablet (500 mg total) by mouth 2 (two) times daily. One po bid x 7 days       Purvis SheffieldForrest Mindie Rawdon, MD 09/29/14 [phone]

## 2014-10-02 LAB — URINE CULTURE: Colony Count: >=100000

## 2014-10-03 ENCOUNTER — Telehealth (HOSPITAL_COMMUNITY): Payer: Self-pay

## 2014-10-03 NOTE — ED Notes (Signed)
Post ED Visit - Positive Culture Follow-up  Culture report reviewed by antimicrobial stewardship pharmacist: []  Barbara Moses, Pharm.D., BCPS [x]  Barbara Moses, Pharm.D., BCPS []  Barbara Moses, Pharm.D., BCPS []  LongtonMinh Moses, [address]Ph[address]D., BCPS, AAHIVP []  Barbara Moses, Pharm.D., BCPS, AAHIVP []  Barbara Moses, Pharm.D. []  Barbara Moses, [address]Ph[address]  Positive urine culture Treated with cipro, organism sensitive to the same and no further patient follow-up is required at this time.  Barbara Moses, Barbara Moses 10/03/2014, 10:06 AM

## 2014-10-09 ENCOUNTER — Encounter (HOSPITAL_BASED_OUTPATIENT_CLINIC_OR_DEPARTMENT_OTHER): Payer: Self-pay | Admitting: Emergency Medicine

## 2014-12-18 ENCOUNTER — Emergency Department (HOSPITAL_BASED_OUTPATIENT_CLINIC_OR_DEPARTMENT_OTHER)
Admission: EM | Admit: 2014-12-18 | Discharge: 2014-12-18 | Disposition: A | Discharge status: Home / Self Care | Payer: Medicaid Other | Attending: Emergency Medicine | Admitting: Emergency Medicine

## 2014-12-18 ENCOUNTER — Encounter (HOSPITAL_BASED_OUTPATIENT_CLINIC_OR_DEPARTMENT_OTHER): Payer: Self-pay | Admitting: *Deleted

## 2014-12-18 DIAGNOSIS — Z8742 Personal history of other diseases of the female genital tract: Secondary | ICD-10-CM | POA: Insufficient documentation

## 2014-12-18 DIAGNOSIS — Z720 Tobacco use: Secondary | ICD-10-CM | POA: Diagnosis not present

## 2014-12-18 DIAGNOSIS — Z792 Long term (current) use of antibiotics: Secondary | ICD-10-CM | POA: Diagnosis not present

## 2014-12-18 DIAGNOSIS — Z79899 Other long term (current) drug therapy: Secondary | ICD-10-CM | POA: Diagnosis not present

## 2014-12-18 DIAGNOSIS — Z8719 Personal history of other diseases of the digestive system: Secondary | ICD-10-CM | POA: Diagnosis not present

## 2014-12-18 DIAGNOSIS — F419 Anxiety disorder, unspecified: Secondary | ICD-10-CM | POA: Diagnosis not present

## 2014-12-18 DIAGNOSIS — Z86718 Personal history of other venous thrombosis and embolism: Secondary | ICD-10-CM | POA: Diagnosis not present

## 2014-12-18 DIAGNOSIS — Z862 Personal history of diseases of the blood and blood-forming organs and certain disorders involving the immune mechanism: Secondary | ICD-10-CM | POA: Diagnosis not present

## 2014-12-18 DIAGNOSIS — Z7952 Long term (current) use of systemic steroids: Secondary | ICD-10-CM | POA: Insufficient documentation

## 2014-12-18 DIAGNOSIS — J020 Streptococcal pharyngitis: Secondary | ICD-10-CM | POA: Insufficient documentation

## 2014-12-18 DIAGNOSIS — E039 Hypothyroidism, unspecified: Secondary | ICD-10-CM | POA: Diagnosis not present

## 2014-12-18 DIAGNOSIS — M797 Fibromyalgia: Secondary | ICD-10-CM | POA: Diagnosis not present

## 2014-12-18 DIAGNOSIS — Z7982 Long term (current) use of aspirin: Secondary | ICD-10-CM | POA: Insufficient documentation

## 2014-12-18 DIAGNOSIS — J029 Acute pharyngitis, unspecified: Secondary | ICD-10-CM | POA: Diagnosis present

## 2014-12-18 DIAGNOSIS — Z880 Allergy status to penicillin: Secondary | ICD-10-CM | POA: Insufficient documentation

## 2014-12-18 DIAGNOSIS — Z791 Long term (current) use of non-steroidal anti-inflammatories (NSAID): Secondary | ICD-10-CM | POA: Insufficient documentation

## 2014-12-18 HISTORY — DX: Fibromyalgia: M79.7

## 2014-12-18 LAB — RAPID STREP SCREEN (MED CTR MEBANE ONLY): Streptococcus, Group A Screen (Direct): POSITIVE — AB

## 2014-12-18 MED ORDER — ACETAMINOPHEN 500 MG PO TABS
500.0000 mg | ORAL_TABLET | Freq: Once | ORAL | Status: AC
  Administered 2014-12-18: 500 mg via ORAL
  Filled 2014-12-18: qty 1

## 2014-12-18 MED ORDER — DEXAMETHASONE SODIUM PHOSPHATE 10 MG/ML IJ SOLN
6.0000 mg | Freq: Once | INTRAMUSCULAR | Status: AC
  Administered 2014-12-18: 6 mg via INTRAMUSCULAR
  Filled 2014-12-18: qty 1

## 2014-12-18 MED ORDER — AZITHROMYCIN 250 MG PO TABS: 250.0000 mg | ORAL_TABLET | Freq: Every day | ORAL | Status: DC

## 2014-12-18 NOTE — ED Notes (Signed)
Pt reports that she woke up this morning with a sore throat, headache, and body aches.

## 2014-12-18 NOTE — Discharge Instructions (Signed)

## 2014-12-18 NOTE — ED Provider Notes (Signed)
CSN: [id number]     Arrival date & time 12/18/14  1335 History   First MD Initiated Contact with Patient 12/18/14 1407     Chief Complaint  Patient presents with  . Sore Throat   Barbara Moses is a 29 y.o. female who presents to the emergency department complaining of a sore throat, headache and body aches since this morning. The patient reports her pain as a 9 out of 10. The patient reports she has pain and swelling but has had no trouble swallowing. Patient feels like her fibromyalgia is flaring up, and complains of general body aches. Patient reports taking tylenol this morning with minimal relief. Patient denies fevers, nausea, vomiting, diarrhea, abdominal pain, dysuria, ear pain, eye pain, cough, shortness of breath or chest pain.  (Consider location/radiation/quality/duration/timing/severity/associated sxs/prior Treatment) HPI  Past Medical History  Diagnosis Date  . Gallstones   . Cervix, short (affecting pregnancy) 03/28/2011  . Anemia   . Family history of DVT 12/17/2011  . Thyroid condition   . BV (bacterial vaginosis)   . DVT (deep venous thrombosis)   . Hypothyroidism   . Anxiety   . Fibromyalgia    Past Surgical History  Procedure Laterality Date  . Cholecystectomy    . Wisdom tooth extraction    . Cholecystectomy, laparoscopic     Family History  Problem Relation Age of Onset  . Fibroids Mother   . Diabetes Father   . Crohn's disease Father   . Cancer Maternal Grandmother    History  Substance Use Topics  . Smoking status: Current Every Day Smoker -- 0.50 packs/day for 7 years    Types: Cigarettes  . Smokeless tobacco: Current User  . Alcohol Use: No   OB History    Gravida Para Term Preterm AB TAB SAB Ectopic Multiple Living   0 0 0 0 0 2     Review of Systems  Constitutional: Positive for chills. Negative for fever.  HENT: Positive for sore throat. Negative for congestion, ear pain, facial swelling, mouth sores and trouble swallowing.    Eyes: Negative for pain and visual disturbance.  Respiratory: Negative for cough, shortness of breath and wheezing.   Cardiovascular: Negative for chest pain and palpitations.  Gastrointestinal: Negative for nausea, vomiting, abdominal pain and diarrhea.  Genitourinary: Negative for dysuria, hematuria and difficulty urinating.  Musculoskeletal: Positive for arthralgias. Negative for back pain, neck pain and neck stiffness.  Skin: Negative for rash and wound.  Neurological: Negative for dizziness, weakness, light-headedness and headaches.      Allergies  Penicillins and Tramadol  Home Medications   Prior to Admission medications   Medication Sig Start Date End Date Taking? Authorizing Provider  acetaminophen (TYLENOL) 500 MG tablet Take 1,500 mg by mouth every 6 (six) hours as needed. For pain    Historical Provider, MD  aspirin 81 MG tablet Take 81 mg by mouth daily.    Historical Provider, MD  azithromycin (ZITHROMAX Z-PAK) 250 MG tablet Take 1 tablet (250 mg total) by mouth daily.  PO day 1, then  PO days 205 12/18/14   Einar Gip Steve Gregg, PA-C  Brompheniramine-Phenylephrine (COLD & ALLERGY PO) Take 2 tablets by mouth daily as needed. For cold symptms    Historical Provider, MD  ciprofloxacin (CIPRO) 500 MG tablet Take 1 tablet (500 mg total) by mouth 2 (two) times daily. One po bid x 7 days 09/29/14   Purvis Sheffield, MD  DULoxetine (CYMBALTA) 30 MG capsule Take 30  mg by mouth daily.    Historical Provider, MD  enoxaparin (LOVENOX) 150 MG/ML injection Inject 0.69 mLs (105 mg total) into the skin daily. 09/03/11 09/10/11  Peggy Constant, MD  fexofenadine-pseudoephedrine (ALLEGRA-D 24 HOUR) 180-240 MG per 24 hr tablet Take 1 tablet by mouth daily. 10/24/12   Raeford RazorStephen Kohut, MD  HYDROcodone-acetaminophen (NORCO/VICODIN) 5-325 MG per tablet Take 2 tablets by mouth every 4 (four) hours as needed. 05/24/13   Elson AreasLeslie K Sofia, PA-C  HYDROcodone-acetaminophen (NORCO/VICODIN) 5-325 MG  per tablet Take 1 tablet by mouth every 6 (six) hours as needed for pain. 06/02/13   Gwyneth SproutWhitney Plunkett, MD  HYDROcodone-acetaminophen (NORCO/VICODIN) 5-325 MG per tablet Take 1 tablet by mouth every 4 (four) hours as needed for pain. 08/02/13   Garlon HatchetLisa M Sanders, PA-C  HYDROcodone-acetaminophen (NORCO/VICODIN) 5-325 MG per tablet Take 1-2 tablets by mouth every 6 (six) hours as needed for moderate pain. 03/10/14   Vanetta MuldersScott Zackowski, MD  hydrocortisone cream 1 % Apply to affected area 2 times daily 05/24/14   April K Palumbo-Rasch, MD  ibuprofen (ADVIL,MOTRIN) 800 MG tablet Take 1 tablet (800 mg total) by mouth 3 (three) times daily. 05/24/13   Elson AreasLeslie K Sofia, PA-C  levothyroxine (SYNTHROID, LEVOTHROID) 50 MCG tablet Take 50 mcg by mouth daily.    Historical Provider, MD  metroNIDAZOLE (FLAGYL) 500 MG tablet Take 1 tablet (500 mg total) by mouth 2 (two) times daily. 05/24/13   Elson AreasLeslie K Sofia, PA-C  naproxen (NAPROSYN) 500 MG tablet Take 1 tablet (500 mg total) by mouth 2 (two) times daily. 03/10/14   Vanetta MuldersScott Zackowski, MD  oxymetazoline (AFRIN NASAL SPRAY) 0.05 % nasal spray Place 2 sprays into the nose 2 (two) times daily. 10/24/12   Raeford RazorStephen Kohut, MD  pseudoephedrine-acetaminophen (TYLENOL SINUS) 30-500 MG TABS Take 1 tablet by mouth every 4 (four) hours as needed.    Historical Provider, MD   BP 98/55 mmHg  Pulse 96  Temp(Src) 100 F (37.8 C) (Oral)  Resp 18  Ht 5' 2.5" (1.588 m)  Wt 130 lb (58.968 kg)  BMI 23.38 kg/m2  SpO2 98%  LMP 12/05/2014 Physical Exam  Constitutional: She appears well-developed and well-nourished. No distress.  HENT:  Head: Normocephalic and atraumatic.  Right Ear: External ear normal.  Left Ear: External ear normal.  Nose: Nose normal.  Mouth/Throat: No oropharyngeal exudate.  Patient's oropharynx is erythematous without tonsillar hypertrophy or exudates. Patient's bilateral tympanic membranes are pearly gray without erythema or loss of landmarks.  Eyes: Conjunctivae are  normal. Pupils are equal, round, and reactive to light. Right eye exhibits no discharge. Left eye exhibits no discharge.  Neck: Normal range of motion. Neck supple.  Cardiovascular: Normal rate, regular rhythm, normal heart sounds and intact distal pulses.  Exam reveals no gallop and no friction rub.   No murmur heard. Heart rate is 88  Pulmonary/Chest: Effort normal and breath sounds normal. No respiratory distress. She has no wheezes. She has no rales.  Abdominal: Soft. She exhibits no distension and no mass. There is no tenderness. There is no rebound and no guarding.  Musculoskeletal: She exhibits no edema.  Lymphadenopathy:    She has no cervical adenopathy.  Neurological: She is alert. Coordination normal.  Skin: Skin is warm and dry. No rash noted. She is not diaphoretic. No erythema. No pallor.  Psychiatric: She has a normal mood and affect. Her behavior is normal.  Nursing note and vitals reviewed.   ED Course  Procedures (including critical care time) Labs Review Labs  Reviewed  RAPID STREP SCREEN - Abnormal; Notable for the following:    Streptococcus, Group A Screen (Direct) POSITIVE (*)    All other components within normal limits    Imaging Review No results found.   EKG Interpretation None      Filed Vitals:   12/18/14 1354 12/18/14 1529  BP: 130/82 98/55  Pulse: 125 96  Temp: 100 F (37.8 C)   TempSrc: Oral   Resp: 20 18  Height: 5' 2.5" (1.588 m)   Weight: 130 lb (58.968 kg)   SpO2: 98% 98%     MDM   Meds given in ED:  Medications  dexamethasone (DECADRON) injection 6 mg (6 mg Intramuscular Given 12/18/14 1455)  acetaminophen (TYLENOL) tablet 500 mg (500 mg Oral Given 12/18/14 1455)    Discharge Medication List as of 12/18/2014  3:09 PM    START taking these medications   Details  azithromycin (ZITHROMAX Z-PAK) 250 MG tablet Take 1 tablet (250 mg total) by mouth daily.  PO day 1, then  PO days 205, Starting 12/18/2014, Until  Discontinued, Print        Final diagnoses:  Strep pharyngitis   This is a 29 year old female who presented to the emergency department complaining of sore throat, headache and body aches since this morning. The patient reports she has been having pain with swallowing but has had no problems swallowing. She was positive for group A strep. Patient has a penicillin allergy so we'll treat this patient with azithromycin. The patient is provided a Decadron shot in the ED as well as Tylenol. Education provided on symptomatic treatment of sore throats. I advised patient to push fluids and to use Tylenol for continued body aches. The maximum daily dose of acetaminophen was discussed with the patient. She was encouraged not to exceed 4,000 mg of acetaminophen during a 24 hour period and was asked to keep in mind that acetaminophen can also be found in many over-the-counter cold medications as well as narcotics that may be given for pain. The patient expresses understanding of these issues and questions were answered. I advised the patient to follow-up with their primary care provider this week. I advised the patient to return to the emergency department with new or worsening symptoms or new concerns. The patient verbalized understanding and agreement with plan.        Lawana Chambers, PA-C 12/18/14 1821  Gwyneth Sprout, MD 12/19/14 [phone]

## 2015-01-18 ENCOUNTER — Encounter: Payer: Self-pay | Admitting: Neurology

## 2015-01-18 ENCOUNTER — Ambulatory Visit (INDEPENDENT_AMBULATORY_CARE_PROVIDER_SITE_OTHER): Payer: Medicaid Other | Admitting: Neurology

## 2015-01-18 VITALS — BP 102/67 | HR 83 | Ht 62.0 in | Wt 127.6 lb

## 2015-01-18 DIAGNOSIS — G43011 Migraine without aura, intractable, with status migrainosus: Secondary | ICD-10-CM

## 2015-01-18 DIAGNOSIS — G43009 Migraine without aura, not intractable, without status migrainosus: Secondary | ICD-10-CM

## 2015-01-18 DIAGNOSIS — M797 Fibromyalgia: Secondary | ICD-10-CM

## 2015-01-18 HISTORY — DX: Migraine without aura, not intractable, without status migrainosus: G43.009

## 2015-01-18 MED ORDER — SUMATRIPTAN SUCCINATE 100 MG PO TABS: 100.0000 mg | ORAL_TABLET | Freq: Two times a day (BID) | ORAL | Status: DC | PRN

## 2015-01-18 MED ORDER — PREGABALIN 25 MG PO CAPS: ORAL_CAPSULE | ORAL | Status: DC

## 2015-01-18 NOTE — Progress Notes (Signed)
Reason for visit: Fibromyalgia  Barbara Moses is a 29 y.o. female  History of present illness:  Barbara Moses is a 29 year old right-handed white female with a history of migraine headaches over the last 2 years. Within the last 6 months, the headaches have converted to being daily in nature. In general, the headaches are bifrontal in nature, associated with throbbing pain. She may have some nausea and vomiting with the headache. There may be photophobia and phonophobia with the headache episodes. She is having headaches virtually every day, and headaches may last all day long. She has been taking Advil and BC powders and Tylenol for the headache. She developed symptoms of fibromyalgia with the last 6-8 months. The pain is migratory in the neck, shoulders, back, and legs, and hips. The patient was seen by a rheumatologist, and blood work apparently was done. The patient was told that she did not have a rheumatologic disease. She was seen by a neurologist, Dr. Okey Dupreose, and amitriptyline was started. The patient could not tolerate the 25 mg dosing, she was reduced to a 10 mg dose, but she barely can tolerate this medication at this dose. She sleeps well at night, but she has a lot of fatigue during the day. She continues to have fibromyalgia pain without change, and ongoing daily headaches. She is sent to this office for an evaluation.  Past Medical History  Diagnosis Date  . Gallstones   . Cervix, short (affecting pregnancy) 03/28/2011  . Anemia   . Family history of DVT 12/17/2011  . Thyroid condition   . BV (bacterial vaginosis)   . DVT (deep venous thrombosis)   . Hypothyroidism   . Anxiety   . Fibromyalgia   . Common migraine 01/18/2015    Past Surgical History  Procedure Laterality Date  . Cholecystectomy    . Wisdom tooth extraction    . Cholecystectomy, laparoscopic      Family History  Problem Relation Age of Onset  . Fibroids Mother   . Diabetes Father   . Crohn's disease  Father   . Cancer Maternal Grandmother     Social history:  reports that she has been smoking Cigarettes.  She has a 3.5 pack-year smoking history. She uses smokeless tobacco. She reports that she does not drink alcohol or use illicit drugs.  Medications:  Current Outpatient Prescriptions on File Prior to Visit  Medication Sig Dispense Refill  . ibuprofen (ADVIL,MOTRIN) 800 MG tablet Take 1 tablet (800 mg total) by mouth 3 (three) times daily. 21 tablet 0  . acetaminophen (TYLENOL) 500 MG tablet Take 1,500 mg by mouth every 6 (six) hours as needed. For pain    . enoxaparin (LOVENOX) 150 MG/ML injection Inject 0.69 mLs (105 mg total) into the skin daily. 25 mL 1   No current facility-administered medications on file prior to visit.      Allergies  Allergen Reactions  . Penicillins Nausea And Vomiting  . Tramadol Itching    ROS:  Out of a complete 14 system review of symptoms, the patient complains only of the following symptoms, and all other reviewed systems are negative.  Fatigue Feeling cold Muscle cramps, aching muscles Headache Anxiety, decreased energy  Blood pressure 102/67, pulse 83, height 5\' 2"  (1.575 m), weight 127 lb 9.6 oz (57.879 kg).  Physical Exam  General: The patient is alert and cooperative at the time of the examination.  Eyes: Pupils are equal, round, and reactive to light. Discs are flat bilaterally.  Neck: The neck is supple, no carotid bruits are noted.  Respiratory: The respiratory examination is clear.  Cardiovascular: The cardiovascular examination reveals a regular rate and rhythm, no obvious murmurs or rubs are noted.  Neuromuscular: Range of movement of the cervical and lumbosacral spine is full.  Skin: Extremities are without significant edema.  Neurologic Exam  Mental status: The patient is alert and oriented x 3 at the time of the examination. The patient has apparent normal recent and remote memory, with an apparently normal  attention span and concentration ability.  Cranial nerves: Facial symmetry is present. There is good sensation of the face to pinprick and soft touch bilaterally. The strength of the facial muscles and the muscles to head turning and shoulder shrug are normal bilaterally. Speech is well enunciated, no aphasia or dysarthria is noted. Extraocular movements are full. Visual fields are full. The tongue is midline, and the patient has symmetric elevation of the soft palate. No obvious hearing deficits are noted.  Motor: The motor testing reveals 5 over 5 strength of all 4 extremities. Good symmetric motor tone is noted throughout.  Sensory: Sensory testing is intact to pinprick, soft touch, vibration sensation, and position sense on all 4 extremities. No evidence of extinction is noted.  Coordination: Cerebellar testing reveals good finger-nose-finger and heel-to-shin bilaterally.  Gait and station: Gait is normal. Tandem gait is normal. Romberg is negative. No drift is seen.  Reflexes: Deep tendon reflexes are symmetric and normal bilaterally. Toes are downgoing bilaterally.   Assessment/Plan:  1. Fibromyalgia  2. Migraine headache  The patient is having ongoing chronic pain issues. The amitriptyline cannot be tolerated in a very high dose, I have suggested the patient stop the medication. We will initiate Lyrica. The patient has been on Cymbalta previously, and she did not tolerate the medication. The patient will follow-up in about 3 or 4 months. We may consider other medications for the migraine such as Maxalt or Imitrex. She will go on magnesium supplementation and riboflavin.   Marlan Palau MD 01/18/2015 9:53 PM  [facility] [address] [place], [place] [address]  Phone [phone] Fax [phone]

## 2015-01-18 NOTE — Patient Instructions (Signed)
   Will stop the amitriptyline, start Lyrica in low dose, follow the directions on the prescription bottle. You can begin taking magnesium oxide, 250 mg twice daily, watch out for diarrhea. You can also begin taking riboflavin, 400 mg daily. This may help migraine headache.  Fibromyalgia Fibromyalgia is a disorder that is often misunderstood. It is associated with muscular pains and tenderness that comes and goes. It is often associated with fatigue and sleep disturbances. Though it tends to be long-lasting, fibromyalgia is not life-threatening. CAUSES  The exact cause of fibromyalgia is unknown. People with certain gene types are predisposed to developing fibromyalgia and other conditions. Certain factors can play a role as triggers, such as:  Spine disorders.  Arthritis.  Severe injury (trauma) and other physical stressors.  Emotional stressors. SYMPTOMS   The main symptom is pain and stiffness in the muscles and joints, which can vary over time.  Sleep and fatigue problems. Other related symptoms may include:  Bowel and bladder problems.  Headaches.  Visual problems.  Problems with odors and noises.  Depression or mood changes.  Painful periods (dysmenorrhea).  Dryness of the skin or eyes. DIAGNOSIS  There are no specific tests for diagnosing fibromyalgia. Patients can be diagnosed accurately from the specific symptoms they have. The diagnosis is made by determining that nothing else is causing the problems. TREATMENT  There is no cure. Management includes medicines and an active, healthy lifestyle. The goal is to enhance physical fitness, decrease pain, and improve sleep. HOME CARE INSTRUCTIONS   Only take over-the-counter or prescription medicines as directed by your caregiver. Sleeping pills, tranquilizers, and pain medicines may make your problems worse.  Low-impact aerobic exercise is very important and advised for treatment. At first, it may seem to make pain  worse. Gradually increasing your tolerance will overcome this feeling.  Learning relaxation techniques and how to control stress will help you. Biofeedback, visual imagery, hypnosis, muscle relaxation, yoga, and meditation are all options.  Anti-inflammatory medicines and physical therapy may provide short-term help.  Acupuncture or massage treatments may help.  Take muscle relaxant medicines as suggested by your caregiver.  Avoid stressful situations.  Plan a healthy lifestyle. This includes your diet, sleep, rest, exercise, and friends.  Find and practice a hobby you enjoy.  Join a fibromyalgia support group for interaction, ideas, and sharing advice. This may be helpful. SEEK MEDICAL CARE IF:  You are not having good results or improvement from your treatment. FOR MORE INFORMATION  National Fibromyalgia Association: www.fmaware.org Arthritis Foundation: www.arthritis.org Document Released: 11/24/2005 Document Revised: 02/16/2012 Document Reviewed: 03/06/2010 [facility] Patient Information 2015 [place], [place]. This information is not intended to replace advice given to you by your health care provider. Make sure you discuss any questions you have with your health care provider.

## 2015-01-19 ENCOUNTER — Ambulatory Visit: Payer: Medicaid Other | Admitting: Neurology

## 2015-05-21 ENCOUNTER — Ambulatory Visit: Payer: Medicaid Other | Admitting: Nurse Practitioner

## 2015-05-22 ENCOUNTER — Encounter: Payer: Self-pay | Admitting: Nurse Practitioner

## 2015-05-30 ENCOUNTER — Telehealth: Payer: Self-pay | Admitting: *Deleted

## 2015-05-30 NOTE — Telephone Encounter (Signed)
I called pt to see about rescheduling appt she missed on Monday 05-21-15.   She stated that she had a lot of family issues and GF passed away.  She could not make it.   She stated her pcp had sent her to 8 different MDs and one being another neurologist (female) that see;s her for other issues as well.  She will continue with her, but did relay that she did like our office and if needed would come back.

## 2015-08-18 ENCOUNTER — Emergency Department (HOSPITAL_BASED_OUTPATIENT_CLINIC_OR_DEPARTMENT_OTHER): Payer: Medicaid Other

## 2015-08-18 ENCOUNTER — Encounter (HOSPITAL_BASED_OUTPATIENT_CLINIC_OR_DEPARTMENT_OTHER): Payer: Self-pay | Admitting: *Deleted

## 2015-08-18 ENCOUNTER — Emergency Department (HOSPITAL_BASED_OUTPATIENT_CLINIC_OR_DEPARTMENT_OTHER)
Admission: EM | Admit: 2015-08-18 | Discharge: 2015-08-18 | Disposition: A | Discharge status: Home / Self Care | Payer: Medicaid Other | Attending: Emergency Medicine | Admitting: Emergency Medicine

## 2015-08-18 DIAGNOSIS — Z8659 Personal history of other mental and behavioral disorders: Secondary | ICD-10-CM | POA: Insufficient documentation

## 2015-08-18 DIAGNOSIS — G43009 Migraine without aura, not intractable, without status migrainosus: Secondary | ICD-10-CM | POA: Diagnosis not present

## 2015-08-18 DIAGNOSIS — J069 Acute upper respiratory infection, unspecified: Secondary | ICD-10-CM | POA: Diagnosis not present

## 2015-08-18 DIAGNOSIS — Z79899 Other long term (current) drug therapy: Secondary | ICD-10-CM | POA: Insufficient documentation

## 2015-08-18 DIAGNOSIS — Z791 Long term (current) use of non-steroidal anti-inflammatories (NSAID): Secondary | ICD-10-CM | POA: Diagnosis not present

## 2015-08-18 DIAGNOSIS — Z862 Personal history of diseases of the blood and blood-forming organs and certain disorders involving the immune mechanism: Secondary | ICD-10-CM | POA: Diagnosis not present

## 2015-08-18 DIAGNOSIS — Z86718 Personal history of other venous thrombosis and embolism: Secondary | ICD-10-CM | POA: Diagnosis not present

## 2015-08-18 DIAGNOSIS — Z720 Tobacco use: Secondary | ICD-10-CM | POA: Diagnosis not present

## 2015-08-18 DIAGNOSIS — E039 Hypothyroidism, unspecified: Secondary | ICD-10-CM | POA: Insufficient documentation

## 2015-08-18 DIAGNOSIS — E079 Disorder of thyroid, unspecified: Secondary | ICD-10-CM | POA: Insufficient documentation

## 2015-08-18 DIAGNOSIS — Z880 Allergy status to penicillin: Secondary | ICD-10-CM | POA: Insufficient documentation

## 2015-08-18 DIAGNOSIS — Z8742 Personal history of other diseases of the female genital tract: Secondary | ICD-10-CM | POA: Diagnosis not present

## 2015-08-18 DIAGNOSIS — Z3202 Encounter for pregnancy test, result negative: Secondary | ICD-10-CM | POA: Insufficient documentation

## 2015-08-18 DIAGNOSIS — Z8719 Personal history of other diseases of the digestive system: Secondary | ICD-10-CM | POA: Insufficient documentation

## 2015-08-18 DIAGNOSIS — R05 Cough: Secondary | ICD-10-CM | POA: Diagnosis present

## 2015-08-18 LAB — PREGNANCY, URINE: Preg Test, Ur: NEGATIVE

## 2015-08-18 MED ORDER — ALBUTEROL SULFATE (2.5 MG/3ML) 0.083% IN NEBU
5.0000 mg | INHALATION_SOLUTION | Freq: Once | RESPIRATORY_TRACT | Status: AC
  Administered 2015-08-18: 5 mg via RESPIRATORY_TRACT
  Filled 2015-08-18: qty 6

## 2015-08-18 MED ORDER — BENZONATATE 100 MG PO CAPS
200.0000 mg | ORAL_CAPSULE | Freq: Once | ORAL | Status: AC
  Administered 2015-08-18: 200 mg via ORAL
  Filled 2015-08-18: qty 2

## 2015-08-18 MED ORDER — ALBUTEROL SULFATE HFA 108 (90 BASE) MCG/ACT IN AERS: 1.0000 | INHALATION_SPRAY | Freq: Four times a day (QID) | RESPIRATORY_TRACT | Status: DC | PRN

## 2015-08-18 MED ORDER — PREDNISONE 50 MG PO TABS
60.0000 mg | ORAL_TABLET | Freq: Once | ORAL | Status: AC
  Administered 2015-08-18: 60 mg via ORAL
  Filled 2015-08-18 (×2): qty 1

## 2015-08-18 MED ORDER — LORATADINE 10 MG PO TABS: 10.0000 mg | ORAL_TABLET | Freq: Every day | ORAL | Status: DC

## 2015-08-18 MED ORDER — PREDNISONE 20 MG PO TABS
ORAL_TABLET | ORAL | Status: DC
Start: 2015-08-18 — End: 2016-08-22

## 2015-08-18 MED ORDER — BENZONATATE 100 MG PO CAPS: 100.0000 mg | ORAL_CAPSULE | Freq: Three times a day (TID) | ORAL | Status: DC

## 2015-08-18 MED ORDER — IPRATROPIUM-ALBUTEROL 0.5-2.5 (3) MG/3ML IN SOLN
3.0000 mL | Freq: Once | RESPIRATORY_TRACT | Status: AC
  Administered 2015-08-18: 3 mL via RESPIRATORY_TRACT
  Filled 2015-08-18: qty 3

## 2015-08-18 MED ORDER — ALBUTEROL SULFATE (2.5 MG/3ML) 0.083% IN NEBU
2.5000 mg | INHALATION_SOLUTION | Freq: Once | RESPIRATORY_TRACT | Status: AC
  Administered 2015-08-18: 2.5 mg via RESPIRATORY_TRACT
  Filled 2015-08-18: qty 3

## 2015-08-18 MED ORDER — E-Z SPACER DEVI: Status: AC

## 2015-08-18 MED ORDER — LORATADINE 10 MG PO TABS
10.0000 mg | ORAL_TABLET | Freq: Once | ORAL | Status: AC
Start: 2015-08-18 — End: 2015-08-18
  Administered 2015-08-18: 10 mg via ORAL
  Filled 2015-08-18: qty 1

## 2015-08-18 NOTE — Discharge Instructions (Signed)
Cool Mist Vaporizers °Vaporizers may help relieve the symptoms of a cough and cold. They add moisture to the air, which helps mucus to become thinner and less sticky. This makes it easier to breathe and cough up secretions. Cool mist vaporizers do not cause serious burns like hot mist vaporizers, which may also be called steamers or humidifiers. Vaporizers have not been proven to help with colds. You should not use a vaporizer if you are allergic to mold. °HOME CARE INSTRUCTIONS °· Follow the package instructions for the vaporizer. °· Do not use anything other than distilled water in the vaporizer. °· Do not run the vaporizer all of the time. This can cause mold or bacteria to grow in the vaporizer. °· Clean the vaporizer after each time it is used. °· Clean and dry the vaporizer well before storing it. °· Stop using the vaporizer if worsening respiratory symptoms develop. °Document Released: 08/21/2004 Document Revised: 11/29/2013 Document Reviewed: 04/13/2013 °ExitCare® Patient Information ©2015 [employer]. This information is not intended to replace advice given to you by your health care provider. Make sure you discuss any questions you have with your health care provider. ° °

## 2015-08-18 NOTE — ED Notes (Signed)
Report given to Charge RN on Pt. Condition and that Pt. Receiving treatment via Resp. In tirage room .

## 2015-08-18 NOTE — ED Provider Notes (Signed)
CSN: [id number]     Arrival date & time 08/18/15  0012 History   First MD Initiated Contact with Patient 08/18/15 [phone]     Chief Complaint  Patient presents with  . URI     (Consider location/radiation/quality/duration/timing/severity/associated sxs/prior Treatment) Patient is a 29 y.o. female presenting with URI. The history is provided by the patient.  URI Presenting symptoms: congestion and cough   Presenting symptoms: no fever   Cough:    Cough characteristics:  Non-productive   Severity:  Moderate   Onset quality:  Gradual   Duration:  1 day   Timing:  Intermittent   Progression:  Unchanged Severity:  Mild Onset quality:  Gradual Duration:  1 day Timing:  Intermittent Progression:  Unchanged Chronicity:  New Relieved by:  Nothing Worsened by:  Nothing tried Ineffective treatments: one dose of mucinex. Associated symptoms: wheezing   Associated symptoms: no sneezing   Risk factors: not elderly     Past Medical History  Diagnosis Date  . Gallstones   . Cervix, short (affecting pregnancy) 03/28/2011  . Anemia   . Family history of DVT 12/17/2011  . Thyroid condition   . BV (bacterial vaginosis)   . DVT (deep venous thrombosis)   . Hypothyroidism   . Anxiety   . Fibromyalgia   . Common migraine 01/18/2015   Past Surgical History  Procedure Laterality Date  . Cholecystectomy    . Wisdom tooth extraction    . Cholecystectomy, laparoscopic     Family History  Problem Relation Age of Onset  . Fibroids Mother   . Diabetes Father   . Crohn's disease Father   . Cancer Maternal Grandmother    Social History  Substance Use Topics  . Smoking status: Current Every Day Smoker -- 0.50 packs/day for 7 years    Types: Cigarettes  . Smokeless tobacco: Current User  . Alcohol Use: No   OB History    Gravida Para Term Preterm AB TAB SAB Ectopic Multiple Living   2 2 1 1  0 0 0 0 0 2     Review of Systems  Constitutional: Negative for fever.  HENT: Positive for  congestion. Negative for sneezing.   Respiratory: Positive for cough and wheezing. Negative for shortness of breath.   Cardiovascular: Negative for chest pain.  All other systems reviewed and are negative.     Allergies  Penicillins and Tramadol  Home Medications   Prior to Admission medications   Medication Sig Start Date End Date Taking? Authorizing Provider  acetaminophen (TYLENOL) 500 MG tablet Take 1,500 mg by mouth every 6 (six) hours as needed. For pain    Historical Provider, MD  enoxaparin (LOVENOX) 150 MG/ML injection Inject 0.69 mLs (105 mg total) into the skin daily. 09/03/11 09/10/11  Peggy Constant, MD  ibuprofen (ADVIL,MOTRIN) 800 MG tablet Take 1 tablet (800 mg total) by mouth 3 (three) times daily. 05/24/13   Elson Areas, PA-C  levothyroxine (SYNTHROID, LEVOTHROID) 100 MCG tablet Take 100 mcg by mouth daily before breakfast.    Historical Provider, MD  pregabalin (LYRICA) 25 MG capsule One capsule twice a day for one week, then take 2 capsules twice a day 01/18/15   York Spaniel, MD  SUMAtriptan (IMITREX) 100 MG tablet Take 1 tablet (100 mg total) by mouth 2 (two) times daily as needed for migraine or headache. 01/18/15   York Spaniel, MD   BP 107/63 mmHg  Pulse 99  Temp(Src) 98.3 F (36.8 C) (Oral)  Resp 14  Ht  (1.575 m)  Wt 130 lb (58.968 kg)  BMI 23.77 kg/m2  SpO2 99%  LMP 07/22/2015 Physical Exam  Constitutional: She is oriented to person, place, and time. She appears well-developed and well-nourished. No distress.  HENT:  Head: Normocephalic and atraumatic.  Mouth/Throat: Oropharynx is clear and moist. No oropharyngeal exudate.  Eyes: Conjunctivae and EOM are normal. Pupils are equal, round, and reactive to light.  Neck: Normal range of motion. Neck supple.  Cardiovascular: Normal rate, regular rhythm and intact distal pulses.   Pulmonary/Chest: Effort normal and breath sounds normal. No stridor. She has no wheezes. She has no rales.   Abdominal: Soft. Bowel sounds are normal. There is no tenderness. There is no rebound and no guarding.  Musculoskeletal: Normal range of motion.  Neurological: She is alert and oriented to person, place, and time.  Skin: Skin is warm and dry.  Psychiatric: She has a normal mood and affect.    ED Course  Procedures (including critical care time) Labs Review Labs Reviewed  PREGNANCY, URINE    Imaging Review Dg Chest 2 View  08/18/2015   CLINICAL DATA:  29 year old female with cough  EXAM: CHEST  2 VIEW  COMPARISON:  Radiograph dated 08/22/2011  FINDINGS: The heart size and mediastinal contours are within normal limits. Both lungs are clear. The visualized skeletal structures are unremarkable.  IMPRESSION: No active cardiopulmonary disease.   Electronically Signed   By: Elgie Collard M.D.   On: 08/18/2015 02:20   I have personally reviewed and evaluated these images and lab results as part of my medical decision-making.   EKG Interpretation None      MDM   Final diagnoses:  None    Medications  predniSONE (DELTASONE) tablet 60 mg (not administered)  loratadine (CLARITIN) tablet 10 mg (not administered)  benzonatate (TESSALON) capsule 200 mg (not administered)  albuterol (PROVENTIL) (2.5 MG/3ML) 0.083% nebulizer solution 5 mg (5 mg Nebulization Given 08/18/15 0112)  ipratropium-albuterol (DUONEB) 0.5-2.5 (3) MG/3ML nebulizer solution 3 mL (3 mLs Nebulization Given 08/18/15 0235)  albuterol (PROVENTIL) (2.5 MG/3ML) 0.083% nebulizer solution 2.5 mg (2.5 mg Nebulization Given 08/18/15 0235)   Results for orders placed or performed during the hospital encounter of 08/18/15  Pregnancy, urine  Result Value Ref Range   Preg Test, Ur NEGATIVE NEGATIVE   Dg Chest 2 View  08/18/2015   CLINICAL DATA:  29 year old female with cough  EXAM: CHEST  2 VIEW  COMPARISON:  Radiograph dated 08/22/2011  FINDINGS: The heart size and mediastinal contours are within normal limits. Both lungs are  clear. The visualized skeletal structures are unremarkable.  IMPRESSION: No active cardiopulmonary disease.   Electronically Signed   By: Elgie Collard M.D.   On: 08/18/2015 02:20    URI improved post albuterol.  Will give rx for inhaler, steroids, claritin and tessalon     Barbara Stegmaier, MD 08/18/15 [phone]

## 2015-08-18 NOTE — ED Notes (Signed)
Pt. Reports colds and coughs around her house.  Pt. Has noted cough with at times coughing yellow sputum up.

## 2016-06-15 ENCOUNTER — Emergency Department (HOSPITAL_BASED_OUTPATIENT_CLINIC_OR_DEPARTMENT_OTHER): Payer: Medicaid Other

## 2016-06-15 ENCOUNTER — Encounter (HOSPITAL_BASED_OUTPATIENT_CLINIC_OR_DEPARTMENT_OTHER): Payer: Self-pay | Admitting: Emergency Medicine

## 2016-06-15 ENCOUNTER — Emergency Department (HOSPITAL_BASED_OUTPATIENT_CLINIC_OR_DEPARTMENT_OTHER)
Admission: EM | Admit: 2016-06-15 | Discharge: 2016-06-15 | Disposition: A | Discharge status: Home / Self Care | Payer: Medicaid Other | Attending: Emergency Medicine | Admitting: Emergency Medicine

## 2016-06-15 DIAGNOSIS — E039 Hypothyroidism, unspecified: Secondary | ICD-10-CM | POA: Diagnosis not present

## 2016-06-15 DIAGNOSIS — M7989 Other specified soft tissue disorders: Secondary | ICD-10-CM | POA: Insufficient documentation

## 2016-06-15 DIAGNOSIS — F17210 Nicotine dependence, cigarettes, uncomplicated: Secondary | ICD-10-CM | POA: Diagnosis not present

## 2016-06-15 NOTE — ED Notes (Signed)
Pt given d/c instructions as per chart. Verbalizes understanding. No questions. 

## 2016-06-15 NOTE — ED Notes (Signed)
Pt describes swelling to LLE  X 3 days. Pain in calf. +dpp palp. Moves toes. Feels touch. Cap refill < 3 sec.

## 2016-06-15 NOTE — ED Provider Notes (Signed)
CSN: [id number]     Arrival date & time 06/15/16  1611 History  By signing my name below, I, Doreatha Martin, attest that this documentation has been prepared under the direction and in the presence of Marily Memos, MD. Electronically Signed: Doreatha Martin, ED Scribe. 06/15/2016. 5:10 PM.     Chief Complaint  Patient presents with  . Joint Swelling   The history is provided by the patient. No language interpreter was used.   HPI Comments: Barbara Moses is a 30 y.o. female who presents to the Emergency Department complaining of moderate left leg and foot swelling onset 3 days ago with associated paresthesia. She denies recent trauma, falls or injury. No worsening or alleviating factors noted. 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 No h/o of similar symptoms.  No recent long travel, surgery, fracture, prolonged immobilization, hormone use. She denies calf pain.  Past Medical History  Diagnosis Date  . Gallstones   . Cervix, short (affecting pregnancy) 03/28/2011  . Anemia   . Family history of DVT 12/17/2011  . Thyroid condition   . BV (bacterial vaginosis)   . DVT (deep venous thrombosis) ([facility])   . Hypothyroidism   . Anxiety   . Fibromyalgia   . Common migraine 01/18/2015   Past Surgical History  Procedure Laterality Date  . Cholecystectomy    . Wisdom tooth extraction    . Cholecystectomy, laparoscopic     Family History  Problem Relation Age of Onset  . Fibroids Mother   . Diabetes Father   . Crohn's disease Father   . Cancer Maternal Grandmother    Social History  Substance Use Topics  . Smoking status: Current Every Day Smoker -- 0.50 packs/day for 7 years    Types: Cigarettes  . Smokeless tobacco: Current User  . Alcohol Use: No   OB History    Gravida Para Term Preterm AB TAB SAB Ectopic Multiple Living   0 0 0 0 0 2     Review of Systems  Cardiovascular: Positive for leg swelling.  Musculoskeletal: Negative for myalgias (calf tenderness).  All  other systems reviewed and are negative.  Allergies  Penicillins and Tramadol  Home Medications   Prior to Admission medications   Medication Sig Start Date End Date Taking? Authorizing Provider  acetaminophen (TYLENOL) 500 MG tablet Take 1,500 mg by mouth every 6 (six) hours as needed. For pain    Historical Provider, MD  albuterol (PROVENTIL HFA;VENTOLIN HFA) 108 (90 BASE) MCG/ACT inhaler Inhale 1-2 puffs into the lungs every 6 (six) hours as needed for wheezing or shortness of breath. 08/18/15   April Palumbo, MD  benzonatate (TESSALON) 100 MG capsule Take 1 capsule (100 mg total) by mouth every 8 (eight) hours. 08/18/15   April Palumbo, MD  enoxaparin (LOVENOX) 150 MG/ML injection Inject 0.69 mLs (105 mg total) into the skin daily. 09/03/11 09/10/11  Peggy Constant, MD  ibuprofen (ADVIL,MOTRIN) 800 MG tablet Take 1 tablet (800 mg total) by mouth 3 (three) times daily. 05/24/13   Elson Areas, PA-C  levothyroxine (SYNTHROID, LEVOTHROID) 100 MCG tablet Take 100 mcg by mouth daily before breakfast.    Historical Provider, MD  loratadine (CLARITIN) 10 MG tablet Take 1 tablet (10 mg total) by mouth daily. 08/18/15   April Palumbo, MD  predniSONE (DELTASONE) 20 MG tablet 3 tabs po day one, then 2 po daily x 4 days 08/18/15   April Palumbo, MD  pregabalin [facility] 25  MG capsule One capsule twice a day for one week, then take 2 capsules twice a day 01/18/15   York Spanielharles K Willis, MD  Spacer/Aero-Holding Chambers (E-Z SPACER) inhaler Use as instructed 08/18/15   April Palumbo, MD  SUMAtriptan (IMITREX) 100 MG tablet Take 1 tablet (100 mg total) by mouth 2 (two) times daily as needed for migraine or headache. 01/18/15   York Spanielharles K Willis, MD   BP 103/64 mmHg  Pulse 76  Temp(Src) 98.2 F (36.8 C) (Oral)  Resp 18  SpO2 100%  LMP 06/09/2016 Physical Exam  Constitutional: She appears well-developed and well-nourished.  HENT:  Head: Normocephalic.  Eyes: Conjunctivae are normal.  Cardiovascular: Normal  rate.   Pulmonary/Chest: Effort normal. No respiratory distress.  Abdominal: She exhibits no distension.  Musculoskeletal: Normal range of motion. She exhibits edema and tenderness.  Left lower extremity swelling. Slight tenderness to left calf.  Diffuse insect bites. No obvious trauma otherwise.   Neurological: She is alert.  Skin: Skin is warm and dry.  Psychiatric: She has a normal mood and affect. Her behavior is normal.  Nursing note and vitals reviewed.   ED Course  Procedures (including critical care time) DIAGNOSTIC STUDIES: Oxygen Saturation is 100% on RA, normal by my interpretation.    COORDINATION OF CARE: 5:08 PM Discussed treatment plan with pt at bedside which includes US and pt agreed to plan.   Labs Review Labs Reviewed - No data to display  Imaging Review [place] Venous Img Lower Unilateral Left  06/15/2016  CLINICAL DATA:  Left lower extremity swelling for 3 days. EXAM: Left LOWER EXTREMITY VENOUS DOPPLER ULTRASOUND TECHNIQUE: Gray-scale sonography with graded compression, as well as color Doppler and duplex ultrasound were performed to evaluate the lower extremity deep venous systems from the level of the common femoral vein and including the common femoral, femoral, profunda femoral, popliteal and calf veins including the posterior tibial, peroneal and gastrocnemius veins when visible. The superficial great saphenous vein was also interrogated. Spectral Doppler was utilized to evaluate flow at rest and with distal augmentation maneuvers in the common femoral, femoral and popliteal veins. COMPARISON:  10/08/2011 FINDINGS: Contralateral Common Femoral Vein: Respiratory phasicity is normal and symmetric with the symptomatic side. No evidence of thrombus. Normal compressibility. Common Femoral Vein: No evidence of thrombus. Normal compressibility, respiratory phasicity and response to augmentation. Saphenofemoral Junction: No evidence of thrombus. Normal compressibility and flow on  color Doppler imaging. Profunda Femoral Vein: No evidence of thrombus. Normal compressibility and flow on color Doppler imaging. Femoral Vein: No evidence of thrombus. Normal compressibility, respiratory phasicity and response to augmentation. Popliteal Vein: No evidence of thrombus. Normal compressibility, respiratory phasicity and response to augmentation. Calf Veins: No evidence of thrombus. Normal compressibility and flow on color Doppler imaging. Superficial Great Saphenous Vein: No evidence of thrombus. Normal compressibility and flow on color Doppler imaging. Venous Reflux:  None. Other Findings:  None. IMPRESSION: No evidence of deep venous thrombosis. Electronically Signed   By: Rudie MeyerP.  Gallerani M.D.   On: 06/15/2016 18:19   I have personally reviewed and evaluated these images and lab results as part of my medical decision-making.   EKG Interpretation None      MDM   Final diagnoses:  Leg swelling    3 days of left lower extremity swelling and calf tenderness. DVT study negative. Unsure the cause of symptoms however she'll continue following up with her primary doctor for further evaluation. We'll put Ace wrap on and elevate as much as possible.  New Prescriptions: Discharge Medication List as of 06/15/2016  6:47 PM       I have personally and contemperaneously reviewed labs and imaging and used in my decision making as above.   A medical screening exam was performed and I feel the patient has had an appropriate workup for their chief complaint at this time and likelihood of emergent condition existing is low and thus workup can continue on an outpatient basis.. Their vital signs are stable. They have been counseled on decision, discharge, follow up and which symptoms necessitate immediate return to the emergency department.  They verbally stated understanding and agreement with plan and discharged in stable condition.   I personally performed the services described in this  documentation, which was scribed in my presence. The recorded information has been reviewed and is accurate.   Marily Memos, MD 06/15/16 2103

## 2016-06-15 NOTE — ED Notes (Signed)
Pt in c/o L ankle swelling with no known injury x 3 days. Ambulatory in NAD.

## 2016-06-15 NOTE — ED Notes (Signed)
MD at bedside. 

## 2016-08-22 ENCOUNTER — Emergency Department (HOSPITAL_BASED_OUTPATIENT_CLINIC_OR_DEPARTMENT_OTHER): Payer: Medicaid Other

## 2016-08-22 ENCOUNTER — Encounter (HOSPITAL_BASED_OUTPATIENT_CLINIC_OR_DEPARTMENT_OTHER): Payer: Self-pay | Admitting: *Deleted

## 2016-08-22 ENCOUNTER — Emergency Department (HOSPITAL_BASED_OUTPATIENT_CLINIC_OR_DEPARTMENT_OTHER)
Admission: EM | Admit: 2016-08-22 | Discharge: 2016-08-22 | Disposition: A | Discharge status: Home / Self Care | Payer: Medicaid Other | Attending: Emergency Medicine | Admitting: Emergency Medicine

## 2016-08-22 DIAGNOSIS — Y999 Unspecified external cause status: Secondary | ICD-10-CM | POA: Insufficient documentation

## 2016-08-22 DIAGNOSIS — M542 Cervicalgia: Secondary | ICD-10-CM | POA: Diagnosis not present

## 2016-08-22 DIAGNOSIS — E039 Hypothyroidism, unspecified: Secondary | ICD-10-CM | POA: Diagnosis not present

## 2016-08-22 DIAGNOSIS — F17210 Nicotine dependence, cigarettes, uncomplicated: Secondary | ICD-10-CM | POA: Insufficient documentation

## 2016-08-22 DIAGNOSIS — Y9389 Activity, other specified: Secondary | ICD-10-CM | POA: Diagnosis not present

## 2016-08-22 DIAGNOSIS — M546 Pain in thoracic spine: Secondary | ICD-10-CM | POA: Diagnosis not present

## 2016-08-22 DIAGNOSIS — M25512 Pain in left shoulder: Secondary | ICD-10-CM | POA: Insufficient documentation

## 2016-08-22 DIAGNOSIS — M7918 Myalgia, other site: Secondary | ICD-10-CM

## 2016-08-22 DIAGNOSIS — Y92410 Unspecified street and highway as the place of occurrence of the external cause: Secondary | ICD-10-CM | POA: Diagnosis not present

## 2016-08-22 DIAGNOSIS — R079 Chest pain, unspecified: Secondary | ICD-10-CM | POA: Insufficient documentation

## 2016-08-22 MED ORDER — NAPROXEN 250 MG PO TABS
500.0000 mg | ORAL_TABLET | Freq: Once | ORAL | Status: AC
  Administered 2016-08-22: 500 mg via ORAL
  Filled 2016-08-22: qty 2

## 2016-08-22 NOTE — Discharge Instructions (Signed)
Please continue to take ibuprofen and tylenol for pain control You still have significant muscle soreness and strain from your accident.  Your chest x-ray is normal.
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
## 2019-06-22 ENCOUNTER — Other Ambulatory Visit: Payer: Self-pay

## 2019-06-22 ENCOUNTER — Emergency Department (HOSPITAL_COMMUNITY): Payer: Medicaid Other

## 2019-06-22 ENCOUNTER — Inpatient Hospital Stay (HOSPITAL_COMMUNITY)
Admission: EM | Admit: 2019-06-22 | Discharge: 2019-06-24 | DRG: 918 | Disposition: A | Discharge status: Home / Self Care | Payer: Medicaid Other | Attending: Internal Medicine | Admitting: Internal Medicine

## 2019-06-22 ENCOUNTER — Encounter (HOSPITAL_COMMUNITY): Payer: Self-pay | Admitting: Emergency Medicine

## 2019-06-22 DIAGNOSIS — D6859 Other primary thrombophilia: Secondary | ICD-10-CM | POA: Diagnosis present

## 2019-06-22 DIAGNOSIS — Y929 Unspecified place or not applicable: Secondary | ICD-10-CM

## 2019-06-22 DIAGNOSIS — Z885 Allergy status to narcotic agent status: Secondary | ICD-10-CM | POA: Diagnosis not present

## 2019-06-22 DIAGNOSIS — R1013 Epigastric pain: Secondary | ICD-10-CM | POA: Diagnosis present

## 2019-06-22 DIAGNOSIS — E785 Hyperlipidemia, unspecified: Secondary | ICD-10-CM | POA: Diagnosis present

## 2019-06-22 DIAGNOSIS — F411 Generalized anxiety disorder: Secondary | ICD-10-CM | POA: Diagnosis present

## 2019-06-22 DIAGNOSIS — Z7982 Long term (current) use of aspirin: Secondary | ICD-10-CM | POA: Diagnosis not present

## 2019-06-22 DIAGNOSIS — Z20828 Contact with and (suspected) exposure to other viral communicable diseases: Secondary | ICD-10-CM | POA: Diagnosis present

## 2019-06-22 DIAGNOSIS — T391X4A Poisoning by 4-Aminophenol derivatives, undetermined, initial encounter: Secondary | ICD-10-CM

## 2019-06-22 DIAGNOSIS — G43909 Migraine, unspecified, not intractable, without status migrainosus: Secondary | ICD-10-CM | POA: Diagnosis present

## 2019-06-22 DIAGNOSIS — R17 Unspecified jaundice: Secondary | ICD-10-CM

## 2019-06-22 DIAGNOSIS — E039 Hypothyroidism, unspecified: Secondary | ICD-10-CM | POA: Diagnosis present

## 2019-06-22 DIAGNOSIS — Z8249 Family history of ischemic heart disease and other diseases of the circulatory system: Secondary | ICD-10-CM | POA: Diagnosis not present

## 2019-06-22 DIAGNOSIS — D649 Anemia, unspecified: Secondary | ICD-10-CM | POA: Diagnosis present

## 2019-06-22 DIAGNOSIS — B159 Hepatitis A without hepatic coma: Secondary | ICD-10-CM | POA: Diagnosis present

## 2019-06-22 DIAGNOSIS — K759 Inflammatory liver disease, unspecified: Secondary | ICD-10-CM

## 2019-06-22 DIAGNOSIS — Z880 Allergy status to penicillin: Secondary | ICD-10-CM

## 2019-06-22 DIAGNOSIS — F17210 Nicotine dependence, cigarettes, uncomplicated: Secondary | ICD-10-CM | POA: Diagnosis present

## 2019-06-22 DIAGNOSIS — Z86718 Personal history of other venous thrombosis and embolism: Secondary | ICD-10-CM | POA: Diagnosis not present

## 2019-06-22 DIAGNOSIS — T391X1A Poisoning by 4-Aminophenol derivatives, accidental (unintentional), initial encounter: Principal | ICD-10-CM | POA: Diagnosis present

## 2019-06-22 DIAGNOSIS — R112 Nausea with vomiting, unspecified: Secondary | ICD-10-CM | POA: Diagnosis present

## 2019-06-22 DIAGNOSIS — Z833 Family history of diabetes mellitus: Secondary | ICD-10-CM

## 2019-06-22 DIAGNOSIS — M797 Fibromyalgia: Secondary | ICD-10-CM | POA: Diagnosis present

## 2019-06-22 DIAGNOSIS — Z9049 Acquired absence of other specified parts of digestive tract: Secondary | ICD-10-CM

## 2019-06-22 DIAGNOSIS — E038 Other specified hypothyroidism: Secondary | ICD-10-CM | POA: Diagnosis present

## 2019-06-22 HISTORY — DX: Hepatitis a without hepatic coma: B15.9

## 2019-06-22 LAB — CBC WITH DIFFERENTIAL/PLATELET
Abs Immature Granulocytes: 0.03 10*3/uL (ref 0.00–0.07)
Basophils Absolute: 0.1 10*3/uL (ref 0.0–0.1)
Basophils Relative: 1 %
Eosinophils Absolute: 0.3 10*3/uL (ref 0.0–0.5)
Eosinophils Relative: 4 %
HCT: 34.2 % — ABNORMAL LOW (ref 36.0–46.0)
Hemoglobin: 11.5 g/dL — ABNORMAL LOW (ref 12.0–15.0)
Immature Granulocytes: 0 %
Lymphocytes Relative: 28 %
Lymphs Abs: 2.2 10*3/uL (ref 0.7–4.0)
MCH: 29.5 pg (ref 26.0–34.0)
MCHC: 33.6 g/dL (ref 30.0–36.0)
MCV: 87.7 fL (ref 80.0–100.0)
Monocytes Absolute: 0.8 10*3/uL (ref 0.1–1.0)
Monocytes Relative: 10 %
Neutro Abs: 4.5 10*3/uL (ref 1.7–7.7)
Neutrophils Relative %: 57 %
Platelets: 364 10*3/uL (ref 150–400)
RBC: 3.9 MIL/uL (ref 3.87–5.11)
RDW: 21 % — ABNORMAL HIGH (ref 11.5–15.5)
WBC: 7.9 10*3/uL (ref 4.0–10.5)
nRBC: 0 % (ref 0.0–0.2)

## 2019-06-22 LAB — SALICYLATE LEVEL
Salicylate Lvl: 15.4 mg/dL (ref 2.8–30.0)
Salicylate Lvl: 12.1 mg/dL (ref 2.8–30.0)
Salicylate Lvl: 11.1 mg/dL (ref 2.8–30.0)
Salicylate Lvl: 11.5 mg/dL (ref 2.8–30.0)

## 2019-06-22 LAB — URINALYSIS, ROUTINE W REFLEX MICROSCOPIC
Bilirubin Urine: NEGATIVE
Glucose, UA: NEGATIVE mg/dL
Hgb urine dipstick: NEGATIVE
Ketones, ur: NEGATIVE mg/dL
Leukocytes,Ua: NEGATIVE
Nitrite: NEGATIVE
Protein, ur: NEGATIVE mg/dL
Specific Gravity, Urine: 1.002 — ABNORMAL LOW (ref 1.005–1.030)
pH: 6 (ref 5.0–8.0)

## 2019-06-22 LAB — I-STAT CHEM 8, ED
BUN: 6 mg/dL (ref 6–20)
Calcium, Ion: 1.08 mmol/L — ABNORMAL LOW (ref 1.15–1.40)
Chloride: 101 mmol/L (ref 98–111)
Creatinine, Ser: 0.8 mg/dL (ref 0.44–1.00)
Glucose, Bld: 120 mg/dL — ABNORMAL HIGH (ref 70–99)
HCT: 39 % (ref 36.0–46.0)
Hemoglobin: 13.3 g/dL (ref 12.0–15.0)
Potassium: 3.5 mmol/L (ref 3.5–5.1)
Sodium: 134 mmol/L — ABNORMAL LOW (ref 135–145)
TCO2: 22 mmol/L (ref 22–32)

## 2019-06-22 LAB — BASIC METABOLIC PANEL
Anion gap: 10 (ref 5–15)
Anion gap: 8 (ref 5–15)
Anion gap: 8 (ref 5–15)
BUN: <5 mg/dL — ABNORMAL LOW (ref 6–20)
BUN: <5 mg/dL — ABNORMAL LOW (ref 6–20)
BUN: <5 mg/dL — ABNORMAL LOW (ref 6–20)
CO2: 22 mmol/L (ref 22–32)
CO2: 23 mmol/L (ref 22–32)
CO2: 23 mmol/L (ref 22–32)
Calcium: 8.5 mg/dL — ABNORMAL LOW (ref 8.9–10.3)
Calcium: 8.3 mg/dL — ABNORMAL LOW (ref 8.9–10.3)
Calcium: 8.6 mg/dL — ABNORMAL LOW (ref 8.9–10.3)
Chloride: 105 mmol/L (ref 98–111)
Chloride: 105 mmol/L (ref 98–111)
Chloride: 105 mmol/L (ref 98–111)
Creatinine, Ser: 0.71 mg/dL (ref 0.44–1.00)
Creatinine, Ser: 0.64 mg/dL (ref 0.44–1.00)
Creatinine, Ser: 0.7 mg/dL (ref 0.44–1.00)
GFR calc Af Amer: >60 mL/min (ref >60)
GFR calc Af Amer: >60 mL/min (ref >60)
GFR calc Af Amer: >60 mL/min (ref >60)
GFR calc non Af Amer: >60 mL/min (ref >60)
GFR calc non Af Amer: >60 mL/min (ref >60)
GFR calc non Af Amer: >60 mL/min (ref >60)
Glucose, Bld: 112 mg/dL — ABNORMAL HIGH (ref 70–99)
Glucose, Bld: 101 mg/dL — ABNORMAL HIGH (ref 70–99)
Glucose, Bld: 80 mg/dL (ref 70–99)
Potassium: 3.3 mmol/L — ABNORMAL LOW (ref 3.5–5.1)
Potassium: 3.5 mmol/L (ref 3.5–5.1)
Potassium: 4.1 mmol/L (ref 3.5–5.1)
Sodium: 137 mmol/L (ref 135–145)
Sodium: 136 mmol/L (ref 135–145)
Sodium: 136 mmol/L (ref 135–145)

## 2019-06-22 LAB — RAPID URINE DRUG SCREEN, HOSP PERFORMED
Amphetamines: NOT DETECTED
Barbiturates: NOT DETECTED
Benzodiazepines: NOT DETECTED
Cocaine: NOT DETECTED
Opiates: POSITIVE — AB
Tetrahydrocannabinol: NOT DETECTED

## 2019-06-22 LAB — HEPATIC FUNCTION PANEL
ALT: 620 U/L — ABNORMAL HIGH (ref 0–44)
AST: 883 U/L — ABNORMAL HIGH (ref 15–41)
Albumin: 2.8 g/dL — ABNORMAL LOW (ref 3.5–5.0)
Alkaline Phosphatase: 129 U/L — ABNORMAL HIGH (ref 38–126)
Bilirubin, Direct: 12.7 mg/dL — ABNORMAL HIGH (ref 0.0–0.2)
Total Bilirubin: 18.1 mg/dL (ref 0.3–1.2)
Total Protein: 8.4 g/dL — ABNORMAL HIGH (ref 6.5–8.1)

## 2019-06-22 LAB — TSH: TSH: 4.858 u[IU]/mL — ABNORMAL HIGH (ref 0.350–4.500)

## 2019-06-22 LAB — I-STAT BETA HCG BLOOD, ED (MC, WL, AP ONLY): I-stat hCG, quantitative: <5 m[IU]/mL (ref <5)

## 2019-06-22 LAB — ETHANOL: Alcohol, Ethyl (B): <10 mg/dL (ref <10)

## 2019-06-22 LAB — PROTIME-INR
INR: 1.2 (ref 0.8–1.2)
Prothrombin Time: 15.5 seconds — ABNORMAL HIGH (ref 11.4–15.2)

## 2019-06-22 LAB — ACETAMINOPHEN LEVEL
Acetaminophen (Tylenol), Serum: 18 ug/mL (ref 10–30)
Acetaminophen (Tylenol), Serum: <10 ug/mL — ABNORMAL LOW (ref 10–30)

## 2019-06-22 LAB — AMMONIA: Ammonia: 36 umol/L — ABNORMAL HIGH (ref 9–35)

## 2019-06-22 LAB — T4, FREE: Free T4: 0.64 ng/dL (ref 0.61–1.12)

## 2019-06-22 LAB — SARS CORONAVIRUS 2 BY RT PCR (HOSPITAL ORDER, PERFORMED IN ~~LOC~~ HOSPITAL LAB): SARS Coronavirus 2: NEGATIVE

## 2019-06-22 MED ORDER — ALBUTEROL SULFATE (2.5 MG/3ML) 0.083% IN NEBU: 2.5000 mg | INHALATION_SOLUTION | Freq: Four times a day (QID) | RESPIRATORY_TRACT | Status: DC | PRN

## 2019-06-22 MED ORDER — SODIUM CHLORIDE 0.9% FLUSH
3.0000 mL | Freq: Two times a day (BID) | INTRAVENOUS | Status: DC
  Administered 2019-06-22 – 2019-06-23 (×3): 3 mL via INTRAVENOUS

## 2019-06-22 MED ORDER — ONDANSETRON HCL 4 MG PO TABS: 4.0000 mg | ORAL_TABLET | Freq: Four times a day (QID) | ORAL | Status: DC | PRN

## 2019-06-22 MED ORDER — ACETYLCYSTEINE LOAD VIA INFUSION
150.0000 mg/kg | Freq: Once | INTRAVENOUS | Status: DC
  Filled 2019-06-22: qty 222

## 2019-06-22 MED ORDER — SODIUM CHLORIDE 0.9 % IV SOLN
INTRAVENOUS | Status: DC
  Administered 2019-06-22 – 2019-06-24 (×6): via INTRAVENOUS

## 2019-06-22 MED ORDER — ACETYLCYSTEINE LOAD VIA INFUSION: 150.0000 mg/kg | Freq: Once | INTRAVENOUS | Status: DC

## 2019-06-22 MED ORDER — POTASSIUM CHLORIDE CRYS ER 20 MEQ PO TBCR
40.0000 meq | EXTENDED_RELEASE_TABLET | ORAL | Status: AC
  Administered 2019-06-22: 40 meq via ORAL
  Filled 2019-06-22: qty 2

## 2019-06-22 MED ORDER — DEXTROSE 5 % IV SOLN
15.0000 mg/kg/h | INTRAVENOUS | Status: DC
  Filled 2019-06-22 (×2): qty 60

## 2019-06-22 MED ORDER — ACETYLCYSTEINE LOAD VIA INFUSION
150.0000 mg/kg | Freq: Once | INTRAVENOUS | Status: AC
  Administered 2019-06-22: 8850 mg via INTRAVENOUS
  Filled 2019-06-22: qty 222

## 2019-06-22 MED ORDER — PANTOPRAZOLE SODIUM 40 MG IV SOLR
40.0000 mg | Freq: Two times a day (BID) | INTRAVENOUS | Status: DC
  Administered 2019-06-22 – 2019-06-23 (×3): 40 mg via INTRAVENOUS
  Filled 2019-06-22 (×3): qty 40

## 2019-06-22 MED ORDER — DEXTROSE 5 % IV SOLN
15.0000 mg/kg/h | INTRAVENOUS | Status: DC
  Administered 2019-06-22 (×2): 15 mg/kg/h via INTRAVENOUS
  Filled 2019-06-22 (×3): qty 60

## 2019-06-22 MED ORDER — ONDANSETRON HCL 4 MG/2ML IJ SOLN
4.0000 mg | Freq: Four times a day (QID) | INTRAMUSCULAR | Status: DC | PRN
  Administered 2019-06-22: 4 mg via INTRAVENOUS
  Filled 2019-06-22: qty 2

## 2019-06-22 NOTE — ED Notes (Addendum)
Pharmacy call per pharmacy to send up Acetadote

## 2019-06-22 NOTE — ED Triage Notes (Signed)
Pt BIB EMS C/O right sided flank pain started Friday. Pt. Jaundice in appearance. No previous HX. Takes "many OTC BC powders."

## 2019-06-22 NOTE — ED Provider Notes (Signed)
[facility] Provider Note   CSN: [id number] Arrival date & time: 06/22/19  0353     History   Chief Complaint Chief Complaint  Patient presents with  . Flank Pain    HPI Barbara Moses is a 33 y.o. female.     The history is provided by the patient.  Flank Pain This is a new problem. The current episode started more than 2 days ago (6 days ). The problem occurs constantly. The problem has not changed since onset.Pertinent negatives include no chest pain, no headaches and no shortness of breath. Nothing aggravates the symptoms. Nothing relieves the symptoms. Treatments tried: gioody powders and oxycodone. The treatment provided no relief.  Patient with fibromyalgia not in pain management who admits to frequent use of BC powders and occasion oxycodone for her pain presents with right flank pain and worsening jaundice that began Friday.  No f/c/r.  No RUQ pain. 3 and 2 days ago had vomiting and now that has resolved.    Past Medical History:  Diagnosis Date  . Anemia   . Anxiety   . BV (bacterial vaginosis)   . Cervix, short (affecting pregnancy) 03/28/2011  . Common migraine 01/18/2015  . DVT (deep venous thrombosis) (HCC)   . Family history of DVT 12/17/2011  . Fibromyalgia   . Gallstones   . Hypothyroidism   . Thyroid condition     Patient Active Problem List   Diagnosis Date Noted  . Tylenol overdose 06/22/2019  . Fibromyalgia 01/18/2015  . Common migraine 01/18/2015  . DVT (deep vein thrombosis) in pregnancy 12/17/2011  . Family history of DVT 12/17/2011  . Anemia 06/26/2011  . Heart burn 06/26/2011    Past Surgical History:  Procedure Laterality Date  . CHOLECYSTECTOMY    . CHOLECYSTECTOMY, LAPAROSCOPIC    . WISDOM TOOTH EXTRACTION       OB History    Gravida  2   Para  2   Term  1   Preterm  1   AB  0   Living  2     SAB  0   TAB  0   Ectopic  0   Multiple  0   Live Births  1             Home Medications    Prior to Admission medications   Medication Sig Start Date End Date Taking? Authorizing Provider  acetaminophen (TYLENOL) 500 MG tablet Take 1,500 mg by mouth every 6 (six) hours as needed. For pain   Yes [provider]  Aspirin-Salicylamide-Caffeine (BC HEADACHE POWDER PO) Take 1 packet by mouth as needed (for pain).   Yes [provider]  albuterol (PROVENTIL HFA;VENTOLIN HFA) 108 (90 BASE) MCG/ACT inhaler Inhale 1-2 puffs into the lungs every 6 (six) hours as needed for wheezing or shortness of breath. Patient not taking: Reported on 06/22/2019 08/18/15   Maverik Foot, MD  benzonatate (TESSALON) 100 MG capsule Take 1 capsule (100 mg total) by mouth every 8 (eight) hours. Patient not taking: Reported on 06/22/2019 08/18/15   Yury Schaus, MD  ibuprofen (ADVIL,MOTRIN) 800 MG tablet Take 1 tablet (800 mg total) by mouth 3 (three) times daily. Patient not taking: Reported on 06/22/2019 05/24/13   Elson AreasSofia, Leslie K, PA-C  loratadine (CLARITIN) 10 MG tablet Take 1 tablet (10 mg total) by mouth daily. Patient not taking: Reported on 06/22/2019 08/18/15   Nicanor AlconPalumbo, Derran Sear, MD  Spacer/Aero-Holding Chambers (E-Z SPACER) inhaler Use as  instructed 08/18/15   Desta Bujak, MD  SUMAtriptan (IMITREX) 100 MG tablet Take 1 tablet (100 mg total) by mouth 2 (two) times daily as needed for migraine or headache. Patient not taking: Reported on 06/22/2019 01/18/15   York SpanielWillis, Charles K, MD    Family History Family History  Problem Relation Age of Onset  . Fibroids Mother   . Diabetes Father   . Crohn's disease Father   . Cancer Maternal Grandmother     Social History Social History   Tobacco Use  . Smoking status: Current Every Day Smoker    Packs/day: 0.50    Years: 7.00    Pack years: 3.50    Types: Cigarettes  . Smokeless tobacco: Current User  Substance Use Topics  . Alcohol use: No  . Drug use: No     Allergies   Penicillins and Tramadol   Review of  Systems Review of Systems  Constitutional: Negative for diaphoresis and fever.  Respiratory: Negative for shortness of breath.   Cardiovascular: Negative for chest pain, palpitations and leg swelling.  Gastrointestinal: Positive for vomiting.  Genitourinary: Positive for flank pain.  Neurological: Negative for headaches.  All other systems reviewed and are negative.    Physical Exam Updated Vital Signs BP 103/69   Pulse 77   Temp 98.4 F (36.9 C) (Oral)   Resp 16   Ht 5\' 2"  (1.575 m)   Wt 59 kg   LMP 06/08/2019   SpO2 100%   BMI 23.78 kg/m   Physical Exam Vitals signs and nursing note reviewed.  Constitutional:      General: She is not in acute distress.    Appearance: She is normal weight.  HENT:     Head: Normocephalic and atraumatic.     Nose: Nose normal.     Mouth/Throat:     Mouth: Mucous membranes are moist.  Eyes:     Extraocular Movements: Extraocular movements intact.     Pupils: Pupils are equal, round, and reactive to light.     Comments: icteric  Neck:     Musculoskeletal: Normal range of motion and neck supple.  Cardiovascular:     Rate and Rhythm: Normal rate and regular rhythm.     Pulses: Normal pulses.     Heart sounds: Normal heart sounds.  Pulmonary:     Effort: Pulmonary effort is normal.     Breath sounds: Normal breath sounds.  Abdominal:     General: Abdomen is flat. Bowel sounds are normal.     Tenderness: There is no abdominal tenderness. There is no guarding or rebound.  Skin:    General: Skin is warm and dry.     Capillary Refill: Capillary refill takes less than 2 seconds.     Coloration: Skin is jaundiced.  Neurological:     General: No focal deficit present.     Mental Status: She is alert and oriented to person, place, and time.     Deep Tendon Reflexes: Reflexes normal.  Psychiatric:        Mood and Affect: Mood normal.        Behavior: Behavior normal.      ED Treatments / Results  Labs (all labs ordered are  listed, but only abnormal results are displayed) Results for orders placed or performed during the hospital encounter of 06/22/19  SARS Coronavirus 2 (CEPHEID - Performed [facility]), [facility]   Specimen: Nasopharyngeal Swab  Result Value Ref Range   SARS Coronavirus  2 NEGATIVE NEGATIVE  CBC with Differential/Platelet  Result Value Ref Range   WBC 7.9 4.0 - 10.5 K/uL   RBC 3.90 3.87 - 5.11 MIL/uL   Hemoglobin 11.5 (L) 12.0 - 15.0 g/dL   HCT 34.2 (L) 36.0 - 46.0 %   MCV 87.7 80.0 - 100.0 fL   MCH 29.5 26.0 - 34.0 pg   MCHC 33.6 30.0 - 36.0 g/dL   RDW 21.0 (H) 11.5 - 15.5 %   Platelets 364 150 - 400 K/uL   nRBC 0.0 0.0 - 0.2 %   Neutrophils Relative % 57 %   Neutro Abs 4.5 1.7 - 7.7 K/uL   Lymphocytes Relative 28 %   Lymphs Abs 2.2 0.7 - 4.0 K/uL   Monocytes Relative 10 %   Monocytes Absolute 0.8 0.1 - 1.0 K/uL   Eosinophils Relative 4 %   Eosinophils Absolute 0.3 0.0 - 0.5 K/uL   Basophils Relative 1 %   Basophils Absolute 0.1 0.0 - 0.1 K/uL   Immature Granulocytes 0 %   Abs Immature Granulocytes 0.03 0.00 - 0.07 K/uL  Urinalysis, Routine w reflex microscopic  Result Value Ref Range   Color, Urine AMBER (A) YELLOW   APPearance CLEAR CLEAR   Specific Gravity, Urine 1.002 (L) 1.005 - 1.030   pH 6.0 5.0 - 8.0   Glucose, UA NEGATIVE NEGATIVE mg/dL   Hgb urine dipstick NEGATIVE NEGATIVE   Bilirubin Urine NEGATIVE NEGATIVE   Ketones, ur NEGATIVE NEGATIVE mg/dL   Protein, ur NEGATIVE NEGATIVE mg/dL   Nitrite NEGATIVE NEGATIVE   Leukocytes,Ua NEGATIVE NEGATIVE  Acetaminophen level  Result Value Ref Range   Acetaminophen (Tylenol), Serum 18 10 - 30 ug/mL  Salicylate level  Result Value Ref Range   Salicylate Lvl 76.1 2.8 - 30.0 mg/dL  Hepatic function panel  Result Value Ref Range   Total Protein 8.4 (H) 6.5 - 8.1 g/dL   Albumin 2.8 (L) 3.5 - 5.0 g/dL   AST 883 (H) 15 - 41 U/L   ALT 620 (H) 0 - 44 U/L   Alkaline Phosphatase 129 (H) 38 - 126 U/L   Total  Bilirubin 18.1 (HH) 0.3 - 1.2 mg/dL   Bilirubin, Direct 12.7 (H) 0.0 - 0.2 mg/dL   Indirect Bilirubin NOT CALCULATED 0.3 - 0.9 mg/dL  Protime-INR  Result Value Ref Range   Prothrombin Time 15.5 (H) 11.4 - 15.2 seconds   INR 1.2 0.8 - 1.2  Ethanol  Result Value Ref Range   Alcohol, Ethyl (B) <10 <10 mg/dL  Rapid urine drug screen (hospital performed)  Result Value Ref Range   Opiates POSITIVE (A) NONE DETECTED   Cocaine NONE DETECTED NONE DETECTED   Benzodiazepines NONE DETECTED NONE DETECTED   Amphetamines NONE DETECTED NONE DETECTED   Tetrahydrocannabinol NONE DETECTED NONE DETECTED   Barbiturates NONE DETECTED NONE DETECTED  I-stat chem 8, ED (not at [facility] or [facility])  Result Value Ref Range   Sodium 134 (L) 135 - 145 mmol/L   Potassium 3.5 3.5 - 5.1 mmol/L   Chloride 101 98 - 111 mmol/L   BUN 6 6 - 20 mg/dL   Creatinine, Ser 0.80 0.44 - 1.00 mg/dL   Glucose, Bld 120 (H) 70 - 99 mg/dL   Calcium, Ion 1.08 (L) 1.15 - 1.40 mmol/L   TCO2 22 22 - 32 mmol/L   Hemoglobin 13.3 12.0 - 15.0 g/dL   HCT 39.0 36.0 - 46.0 %  I-Stat Beta hCG blood, ED ([facility], [facility], [facility] only)  Result Value  Ref Range   I-stat hCG, quantitative <5.0 <5 mIU/mL   Comment 3           No results found.  EKG EKG Interpretation  Date/Time:  Wednesday June 22 2019 05:04:19 EDT Ventricular Rate:  95 PR Interval:    QRS Duration: 76 QT Interval:  332 QTC Calculation: 418 R Axis:   73 Text Interpretation:  Sinus rhythm Borderline short PR interval Confirmed by Tonica Brasington ([id number]) on 06/22/2019 5:06:01 AM   Radiology No results found.  Procedures Procedures (including critical care time)  Medications Ordered in ED Medications  0.9 %  sodium chloride infusion ( Intravenous New Bag/Given 06/22/19 0507)  acetylcysteine (ACETADOTE) 40 mg/mL load via infusion 8,850 mg (has no administration in time range)    Followed by  acetylcysteine (ACETADOTE) 12,000 mg in dextrose 5 % 300 mL (40 mg/mL) infusion (has no  administration in time range)     MDM Interpretation: labs (elevated Bilirubin positive opiated on UDS) Total time providing critical care: 30-74 minutes. This excludes time spent performing separately reportable procedures and services. Consults: admitting MD (poison control who recommends NAC)  CRITICAL CARE Performed by: Maverick Dieudonne K Burnell Hurta-Rasch Total critical care time: 61  minutes Critical care time was exclusive of separately billable procedures and treating other patients. Critical care was necessary to treat or prevent imminent or life-threatening deterioration. Critical care was time spent personally by me on the following activities: development of treatment plan with patient and/or surrogate as well as nursing, discussions with consultants, evaluation of patient's response to treatment, examination of patient, obtaining history from patient or surrogate, ordering and performing treatments and interventions, ordering and review of laboratory studies, ordering and review of radiographic studies, pulse oximetry and re-evaluation of patient's condition.   Final Clinical Impressions(s) / ED Diagnoses   Final diagnoses:  Acetaminophen overdose of undetermined intent, initial encounter  Jaundice  Hepatitis    Admit for NAC, will need repeat tylenol and salicylate levels 4 hours post first    Larie Mathes, MD 06/22/19 0630

## 2019-06-22 NOTE — ED Notes (Signed)
Date and time results received: 06/22/19  (use smartphrase ".now" to insert current time)  Test:  Bilirubin Critical Value: 18.1  Name of Provider Notified: Palumbo   Orders Received? Or Actions Taken?: Palumbo to Call [facility]

## 2019-06-22 NOTE — ED Notes (Signed)
Patient transported to Ultrasound 

## 2019-06-22 NOTE — ED Notes (Signed)
Pharmacy called Nurse waiting on Acetadote. Pharmacy to send up after mixing medication.

## 2019-06-22 NOTE — H&P (Signed)
History and Physical    Barbara Moses:[id number] DOB: [birth year] DOA: 06/22/2019  Referring MD/NP/PA: Midge Minium, MD PCP: Ardyth Gal, MD  Patient coming from: Home  Chief Complaint: Right leg pain.  I have personally briefly reviewed patient's old medical records in [facility]   HPI: Barbara Moses is a 33 y.o. female with medical history significant of fibromyalgia, hypothyroidism, DVT, anemia, and anxiety; who presented with complaints of right-sided flank pain.  Symptoms started 5 to 6 days ago.  She initially attributed symptoms to over exerting herself while cleaning.  However, she began to notice yellowing of her eyes and then skin.  Other associated symptoms included nausea, vomiting, epigastric pressure, and discomfort.  Emesis was noted to be nonbloody in appearance.  Denies having any fever, diarrhea, blood in stools or suicidal ideation symptoms.  She takes aspirin daily for history of DVT 7 or 8 years ago after the birth of her son.  Admits that she was taking BC Goody and regular Goody's powders 1 to 2 packets up to 3 times a day for which she states history of fibromyalgia with migraine.  Patient notes that she has a history of hypothyroidism, but was not on any medication for treatment.  She is in need of establishing care with a new primary care provider.  ED Course: Upon admission into the emergency department patient noted to have vital signs within normal limits.  Labs revealed hemoglobin 11.5, sodium 134, alkaline phosphatase 129, AST 883, ALT 620, total bilirubin 18.1,  direct bilirubin 12.7, TSH 4.858, acetaminophen level 18, and salicylate level 15.4.  Urine drug screen positive for opiates.  Abdominal ultrasound revealed a mildly dilated common bile duct of 7 cm which can be seen after cholecystectomy.  Poison control has been contacted.  Patient was started on normal saline at 125 mL/h and acetylcysteine was ordered.   Review of Systems  Constitutional:  Positive for malaise/fatigue. Negative for fever.  HENT: Negative for congestion and nosebleeds.   Eyes: Negative for double vision and pain.  Respiratory: Negative for cough and shortness of breath.   Cardiovascular: Negative for chest pain and leg swelling.  Gastrointestinal: Positive for abdominal pain, nausea and vomiting. Negative for blood in stool and diarrhea.  Genitourinary: Negative for dysuria and hematuria.  Musculoskeletal: Positive for myalgias. Negative for falls.  Skin: Negative for itching.  Neurological: Positive for headaches. Negative for tingling, loss of consciousness and weakness.  Endo/Heme/Allergies: Negative for polydipsia. Bruises/bleeds easily.  Psychiatric/Behavioral: Negative for substance abuse and suicidal ideas. The patient is nervous/anxious.     Past Medical History:  Diagnosis Date  . Anemia   . Anxiety   . BV (bacterial vaginosis)   . Cervix, short (affecting pregnancy) 03/28/2011  . Common migraine 01/18/2015  . DVT (deep venous thrombosis) (HCC)   . Family history of DVT 12/17/2011  . Fibromyalgia   . Gallstones   . Hypothyroidism   . Thyroid condition     Past Surgical History:  Procedure Laterality Date  . CHOLECYSTECTOMY    . CHOLECYSTECTOMY, LAPAROSCOPIC    . WISDOM TOOTH EXTRACTION       reports that she has been smoking cigarettes. She has a 3.50 pack-year smoking history. She uses smokeless tobacco. She reports that she does not drink alcohol or use drugs.  Allergies  Allergen Reactions  . Penicillins Nausea And Vomiting  . Tramadol Itching    Family History  Problem Relation Age of Onset  . Fibroids Mother   .  Diabetes Father   . Crohn's disease Father   . Cancer Maternal Grandmother     Prior to Admission medications   Medication Sig Start Date End Date Taking? Authorizing Provider  acetaminophen (TYLENOL) 500 MG tablet Take 1,500 mg by mouth every 6 (six) hours as needed. For pain   Yes [provider]   Aspirin-Salicylamide-Caffeine (BC HEADACHE POWDER PO) Take 1 packet by mouth as needed (for pain).   Yes [provider]  albuterol (PROVENTIL HFA;VENTOLIN HFA) 108 (90 BASE) MCG/ACT inhaler Inhale 1-2 puffs into the lungs every 6 (six) hours as needed for wheezing or shortness of breath. Patient not taking: Reported on 06/22/2019 08/18/15   Palumbo, April, MD  benzonatate (TESSALON) 100 MG capsule Take 1 capsule (100 mg total) by mouth every 8 (eight) hours. Patient not taking: Reported on 06/22/2019 08/18/15   Palumbo, April, MD  ibuprofen (ADVIL,MOTRIN) 800 MG tablet Take 1 tablet (800 mg total) by mouth 3 (three) times daily. Patient not taking: Reported on 06/22/2019 05/24/13   Elson AreasSofia, Leslie K, PA-C  loratadine (CLARITIN) 10 MG tablet Take 1 tablet (10 mg total) by mouth daily. Patient not taking: Reported on 06/22/2019 08/18/15   Palumbo, April, MD  Spacer/Aero-Holding Chambers (E-Z SPACER) inhaler Use as instructed 08/18/15   Palumbo, April, MD  SUMAtriptan (IMITREX) 100 MG tablet Take 1 tablet (100 mg total) by mouth 2 (two) times daily as needed for migraine or headache. Patient not taking: Reported on 06/22/2019 01/18/15   York SpanielWillis, Charles K, MD    Physical Exam:  Constitutional: Young female who appears sick, but able to follow commands Vitals:   06/22/19 0515 06/22/19 0545 06/22/19 0700 06/22/19 0815  BP: 107/63 103/69 105/88 117/85  Pulse: 78 77 87 75  Resp: 14 16 15 17   Temp:    98.1 F (36.7 C)  TempSrc:    Oral  SpO2: 97% 100% 99% 100%  Weight:      Height:       Eyes: PERRL, scleral icterus present  ENMT: Mucous membranes are dry. Posterior pharynx clear of any exudate or lesions.  Neck: normal, supple, no masses, no thyromegaly Respiratory: clear to auscultation bilaterally, no wheezing, no crackles. Normal respiratory effort. No accessory muscle use.  Cardiovascular: Regular rate and rhythm, no murmurs / rubs / gallops.  Trace lower extremity edema. 2+ pedal pulses.  No carotid bruits.  Abdomen: no tenderness, no masses palpated. No hepatosplenomegaly. Bowel sounds positive.  Musculoskeletal: no clubbing / cyanosis. No joint deformity upper and lower extremities. Good ROM, no contractures. Normal muscle tone.  Skin: Mild bruising noted of the lower extremities. Neurologic: CN 2-12 grossly intact. Sensation intact, DTR normal. Strength 5/5 in all 4.  Psychiatric: Normal judgment and insight. Alert and oriented x 3. Normal mood.     Labs on Admission: I have personally reviewed following labs and imaging studies  CBC: Recent Labs  Lab 06/22/19 0355 06/22/19 0410  WBC 7.9  --   NEUTROABS 4.5  --   HGB 11.5* 13.3  HCT 34.2* 39.0  MCV 87.7  --   PLT 364  --    Basic Metabolic Panel: Recent Labs  Lab 06/22/19 0410  NA 134*  K 3.5  CL 101  GLUCOSE 120*  BUN 6  CREATININE 0.80   GFR: Estimated Creatinine Clearance: 79.1 mL/min (by C-G formula based on SCr of 0.8 mg/dL). Liver Function Tests: Recent Labs  Lab 06/22/19 0355  AST 883*  ALT 620*  ALKPHOS 129*  BILITOT  18.1*  PROT 8.4*  ALBUMIN 2.8*   No results for input(s): LIPASE, AMYLASE in the last 168 hours. No results for input(s): AMMONIA in the last 168 hours. Coagulation Profile: Recent Labs  Lab 06/22/19 0355  INR 1.2   Cardiac Enzymes: No results for input(s): CKTOTAL, CKMB, CKMBINDEX, TROPONINI in the last 168 hours. BNP (last 3 results) No results for input(s): PROBNP in the last 8760 hours. HbA1C: No results for input(s): HGBA1C in the last 72 hours. CBG: No results for input(s): GLUCAP in the last 168 hours. Lipid Profile: No results for input(s): CHOL, HDL, LDLCALC, TRIG, CHOLHDL, LDLDIRECT in the last 72 hours. Thyroid Function Tests: Recent Labs    06/22/19 0517  TSH 4.858*   Anemia Panel: No results for input(s): VITAMINB12, FOLATE, FERRITIN, TIBC, IRON, RETICCTPCT in the last 72 hours. Urine analysis:    Component Value Date/Time   COLORURINE  AMBER (A) 06/22/2019 0401   APPEARANCEUR CLEAR 06/22/2019 0401   LABSPEC 1.002 (L) 06/22/2019 0401   PHURINE 6.0 06/22/2019 0401   GLUCOSEU NEGATIVE 06/22/2019 0401   HGBUR NEGATIVE 06/22/2019 0401   BILIRUBINUR NEGATIVE 06/22/2019 0401   KETONESUR NEGATIVE 06/22/2019 0401   PROTEINUR NEGATIVE 06/22/2019 0401   UROBILINOGEN 0.2 09/29/2014 1625   NITRITE NEGATIVE 06/22/2019 0401   LEUKOCYTESUR NEGATIVE 06/22/2019 0401   Sepsis Labs: Recent Results (from the past 240 hour(s))  SARS Coronavirus 2 (CEPHEID - Performed [facility]), Hosp Order     Status: None   Collection Time: 06/22/19  4:10 AM   Specimen: Nasopharyngeal Swab  Result Value Ref Range Status   SARS Coronavirus 2 NEGATIVE NEGATIVE Final    Comment: (NOTE) If result is NEGATIVE SARS-CoV-2 target nucleic acids are NOT DETECTED. The SARS-CoV-2 RNA is generally detectable in upper and lower  respiratory specimens during the acute phase of infection. The lowest  concentration of SARS-CoV-2 viral copies this assay can detect is 250  copies / mL. A negative result does not preclude SARS-CoV-2 infection  and should not be used as the sole basis for treatment or other  patient management decisions.  A negative result may occur with  improper specimen collection / handling, submission of specimen other  than nasopharyngeal swab, presence of viral mutation(s) within the  areas targeted by this assay, and inadequate number of viral copies  (<250 copies / mL). A negative result must be combined with clinical  observations, patient history, and epidemiological information. If result is POSITIVE SARS-CoV-2 target nucleic acids are DETECTED. The SARS-CoV-2 RNA is generally detectable in upper and lower  respiratory specimens dur ing the acute phase of infection.  Positive  results are indicative of active infection with SARS-CoV-2.  Clinical  correlation with patient history and other diagnostic information is   necessary to determine patient infection status.  Positive results do  not rule out bacterial infection or co-infection with other viruses. If result is PRESUMPTIVE POSTIVE SARS-CoV-2 nucleic acids MAY BE PRESENT.   A presumptive positive result was obtained on the submitted specimen  and confirmed on repeat testing.  While 2019 novel coronavirus  (SARS-CoV-2) nucleic acids may be present in the submitted sample  additional confirmatory testing may be necessary for epidemiological  and / or clinical management purposes  to differentiate between  SARS-CoV-2 and other Sarbecovirus currently known to infect humans.  If clinically indicated additional testing with an alternate test  methodology [phone]([id number]) is advised. The SARS-CoV-2 RNA is generally  detectable in upper and lower respiratory  sp ecimens during the acute  phase of infection. The expected result is Negative. Fact Sheet for Patients:  BoilerBrush.com.cyhttps://www.fda.gov/media/136312/download Fact Sheet for Healthcare Providers: https://pope.com/https://www.fda.gov/media/136313/download This test is not yet approved or cleared by the [place] FDA and has been authorized for detection and/or diagnosis of SARS-CoV-2 by FDA under an Emergency Use Authorization (EUA).  This EUA will remain in effect (meaning this test can be used) for the duration of the COVID-19 declaration under Section 564(b)(1) of the Act, 21 U.S.C. section 360bbb-3(b)(1), unless the authorization is terminated or revoked sooner. Performed at [facility], [address] [place], [place] [address]      Radiological Exams on Admission: [place] Abdomen Limited  Result Date: 06/22/2019 CLINICAL DATA:  Abdominal pain EXAM: ULTRASOUND ABDOMEN LIMITED RIGHT UPPER QUADRANT COMPARISON:  None available FINDINGS: Gallbladder: History of cholecystectomy Common bile duct: Diameter: 7 mm.  Where visualized, no filling defect. Liver: No focal lesion identified. Within normal limits in parenchymal  echogenicity. Portal vein is patent on color Doppler imaging with normal direction of blood flow towards the liver. IMPRESSION: Mildly dilated common bile duct that is usually reservoir effect after cholecystectomy. Follow-up could be obtained depending on trend of liver function tests. Electronically Signed   By: Marnee SpringJonathon  Watts M.D.   On: 06/22/2019 06:54    EKG: Independently reviewed.  Sinus rhythm at 95 bpm  Assessment/Plan Acetaminophen and salicylate overdose: Patient reports taking BC Goody powders and regular Goody powders multiple times a day for fibromyalgia symptoms.  On admission acetaminophen level 18 and salicylate level 15.4.  Poison control, was contacted recommending serial monitoring of BMP and salicylate levels and daily monitoring of liver function studies.  Patient denies any thoughts of wanting to hurt herself. -Admit to a telemetry bed -Avoid acetaminophen and salicylates -Acetylcysteine drip per pharmacy -BMP and salicylate level every 3 hours 3 -Recheck acetaminophen and liver enzymes in a.m. -Normal saline IV fluids at 125 mL/h   Elevated liver enzymes, hyperbilirubinemia: Acute.  Patient appears to be alert and oriented x3.  Suspect secondary to above overdose of Tylenol and acetaminophen.  Ultrasound of the abdomen noted mildly dilated common bile duct, but patient status post cholecystectomy which can be noted normal. -Add on ammonia level -Follow-up hepatitis panel -Avoiding medications processed through the liver -Daily monitoring of liver enzymes, PT/INR  Nausea and vomiting: Suspect secondary to above. -Symptomatic treatment -Antiemetics as needed   Epigastric discomfort: Patient reports epigastric pressure and discomfort that has intermittently been present.  Given history of BC Goody powder use question the possibility of ulcer.  Patient currently not complaining of pain at this time. -Protonix IV BID -Consider need of formal consultation to GI, if  symptoms do not improve with symptomatic treatment  Normocytic anemia: Hemoglobin 11.5 g/dL.  Patient denies any reports of bleeding. -Recheck CBC in a.m.  Subclinical hypothyroidism: TSH 4.858 on admission.  Patient does not appear to be on levothyroxine. -Check free T4 (lower limit of normal at 0.64)   History of DVT: Patient reports history of DVT 7-8 years ago after the birth of her son, for which patient had been on aspirin for treatment -Hold aspirin for now  Fibromyalgia, migraine headache: Patient reports a history of fibromyalgia and migraine headaches. -Avoiding medications with Tylenol  General anxiety disorder: Patient's not on any medication for treatment at this time.  Denies any reports of wanting to harm herself.   DVT prophylaxis: SCD Code Status: Full Family Communication: No family present at bedside Disposition  Plan: Likely discharge home in 2 to 3 days Consults called: Poison control Admission status: Inpatient  Norval Morton MD [facility] Pager [phone]   If 7PM-7AM, please contact night-coverage [email or web address] Password [facility]  06/22/2019, 8:19 AM

## 2019-06-22 NOTE — ED Notes (Signed)
ED TO INPATIENT HANDOFF REPORT  ED Nurse Name and Phone #:  [phone]  S Name/Age/Gender Barbara Moses 33 y.o. female Room/Bed: 029C/029C  Code Status   Code Status: Not on file  Home/SNF/Other Home Patient oriented to: self, place, time and situation Is this baseline? Yes   Triage Complete: Triage complete  Chief Complaint abdominal pain   Triage Note Pt BIB EMS C/O right sided flank pain started Friday. Pt. Jaundice in appearance. No previous HX. Takes "many OTC BC powders."    Allergies Allergies  Allergen Reactions  . Penicillins Nausea And Vomiting  . Tramadol Itching    Level of Care/Admitting Diagnosis ED Disposition    ED Disposition Condition Comment   Admit  [facility]ospital [facility]: [facility] [[id number]]  Level of Care: Telemetry Medical [104]  Covid Evaluation: Asymptomatic Screening Protocol (No Symptoms)  Diagnosis: Tylenol overdose [098119][[id number]]  Admitting Physician: Eduard ClosKAKRAKANDY, ARSHAD N [phone][[id number]]  Attending Physician: Eduard ClosKAKRAKANDY, ARSHAD N [phone][[id number]]  Estimated length of stay: past midnight tomorrow  Certification:: I certify this patient will need inpatient services for at least 2 midnights  PT Class (Do Not Modify): Inpatient [101]  PT Acc Code (Do Not Modify): Private [1]       B Medical/Surgery History Past Medical History:  Diagnosis Date  . Anemia   . Anxiety   . BV (bacterial vaginosis)   . Cervix, short (affecting pregnancy) 03/28/2011  . Common migraine 01/18/2015  . DVT (deep venous thrombosis) (HCC)   . Family history of DVT 12/17/2011  . Fibromyalgia   . Gallstones   . Hypothyroidism   . Thyroid condition    Past Surgical History:  Procedure Laterality Date  . CHOLECYSTECTOMY    . CHOLECYSTECTOMY, LAPAROSCOPIC    . WISDOM TOOTH EXTRACTION       A IV Location/Drains/Wounds Patient Lines/Drains/Airways Status   Active Line/Drains/Airways    Name:   Placement date:   Placement time:   Site:   Days:   Peripheral IV  06/22/19 Left Antecubital   06/22/19    0354    Antecubital   less than 1          Intake/Output Last 24 hours No intake or output data in the 24 hours ending 06/22/19 0644  Labs/Imaging Results for orders placed or performed during the hospital encounter of 06/22/19 (from the past 48 hour(s))  CBC with Differential/Platelet     Status: Abnormal   Collection Time: 06/22/19  3:55 AM  Result Value Ref Range   WBC 7.9 4.0 - 10.5 K/uL   RBC 3.90 3.87 - 5.11 MIL/uL   Hemoglobin 11.5 (L) 12.0 - 15.0 g/dL   HCT 21.334.2 (L) 08.636.0 - 57.846.0 %   MCV 87.7 80.0 - 100.0 fL   MCH 29.5 26.0 - 34.0 pg   MCHC 33.6 30.0 - 36.0 g/dL   RDW 46.921.0 (H) 62.911.5 - 52.815.5 %   Platelets 364 150 - 400 K/uL   nRBC 0.0 0.0 - 0.2 %   Neutrophils Relative % 57 %   Neutro Abs 4.5 1.7 - 7.7 K/uL   Lymphocytes Relative 28 %   Lymphs Abs 2.2 0.7 - 4.0 K/uL   Monocytes Relative 10 %   Monocytes Absolute 0.8 0.1 - 1.0 K/uL   Eosinophils Relative 4 %   Eosinophils Absolute 0.3 0.0 - 0.5 K/uL   Basophils Relative 1 %   Basophils Absolute 0.1 0.0 - 0.1 K/uL   Immature Granulocytes 0 %   Abs Immature Granulocytes 0.03  0.00 - 0.07 K/uL    Comment: Performed at [facility], [place] [address], [place], [place] [address]  Hepatic function panel     Status: Abnormal   Collection Time: 06/22/19  3:55 AM  Result Value Ref Range   Total Protein 8.4 (H) 6.5 - 8.1 g/dL   Albumin 2.8 (L) 3.5 - 5.0 g/dL   AST 883 (H) 15 - 41 U/L   ALT 620 (H) 0 - 44 U/L   Alkaline Phosphatase 129 (H) 38 - 126 U/L   Total Bilirubin 18.1 (HH) 0.3 - 1.2 mg/dL    Comment: CRITICAL RESULT CALLED TO, READ BACK BY AND VERIFIED WITH: OSARIO B,RN 06/22/19 0506 WAYK    Bilirubin, Direct 12.7 (H) 0.0 - 0.2 mg/dL    Comment: RESULTS CONFIRMED BY MANUAL DILUTION   Indirect Bilirubin NOT CALCULATED 0.3 - 0.9 mg/dL    Comment: Performed at [place] [address], [place], [place] [address]  Protime-INR     Status: Abnormal   Collection Time:  06/22/19  3:55 AM  Result Value Ref Range   Prothrombin Time 15.5 (H) 11.4 - 15.2 seconds   INR 1.2 0.8 - 1.2    Comment: (NOTE) INR goal varies based on device and disease states. Performed at [facility], [place] [address], [place], [place] [address]   Acetaminophen level     Status: None   Collection Time: 06/22/19  4:00 AM  Result Value Ref Range   Acetaminophen (Tylenol), Serum 18 10 - 30 ug/mL    Comment: (NOTE) Therapeutic concentrations vary significantly. A range of 10-30 ug/mL  may be an effective concentration for many patients. However, some  are best treated at concentrations outside of this range. Acetaminophen concentrations >150 ug/mL at 4 hours after ingestion  and >50 ug/mL at 12 hours after ingestion are often associated with  toxic reactions. Performed at [facility], [place] [address], [place], [place] [address]   Salicylate level     Status: None   Collection Time: 06/22/19  4:00 AM  Result Value Ref Range   Salicylate Lvl 18.2 2.8 - 30.0 mg/dL    Comment: Performed at [facility] [address], [place], [place] [address]  Ethanol     Status: None   Collection Time: 06/22/19  4:00 AM  Result Value Ref Range   Alcohol, Ethyl (B) <10 <10 mg/dL    Comment: (NOTE) Lowest detectable limit for serum alcohol is 10 mg/dL. For medical purposes only. Performed at [facility], [place] [address], [place], [place] [address]   Urinalysis, Routine w reflex microscopic     Status: Abnormal   Collection Time: 06/22/19  4:01 AM  Result Value Ref Range   Color, Urine AMBER (A) YELLOW    Comment: BIOCHEMICALS MAY BE AFFECTED BY COLOR   APPearance CLEAR CLEAR   Specific Gravity, Urine 1.002 (L) 1.005 - 1.030   pH 6.0 5.0 - 8.0   Glucose, UA NEGATIVE NEGATIVE mg/dL   Hgb urine dipstick NEGATIVE NEGATIVE   Bilirubin Urine NEGATIVE NEGATIVE   Ketones, ur NEGATIVE NEGATIVE mg/dL   Protein, ur NEGATIVE NEGATIVE mg/dL   Nitrite NEGATIVE NEGATIVE    Leukocytes,Ua NEGATIVE NEGATIVE    Comment: Performed at [facility] [address] [place], [place] [address]  Rapid urine drug screen (hospital performed)     Status: Abnormal   Collection Time: 06/22/19  4:01 AM  Result Value Ref  Range   Opiates POSITIVE (A) NONE DETECTED   Cocaine NONE DETECTED NONE DETECTED   Benzodiazepines NONE DETECTED NONE DETECTED   Amphetamines NONE DETECTED NONE DETECTED   Tetrahydrocannabinol NONE DETECTED NONE DETECTED   Barbiturates NONE DETECTED NONE DETECTED    Comment: (NOTE) DRUG SCREEN FOR MEDICAL PURPOSES ONLY.  IF CONFIRMATION IS NEEDED FOR ANY PURPOSE, NOTIFY LAB WITHIN 5 DAYS. LOWEST DETECTABLE LIMITS FOR URINE DRUG SCREEN Drug Class                     Cutoff (ng/mL) Amphetamine and metabolites    1000 Barbiturate and metabolites    200 Benzodiazepine                 200 Tricyclics and metabolites     300 Opiates and metabolites        300 Cocaine and metabolites        300 THC                            50 Performed at [facility], [address] [place], [place] [address]   I-Stat Beta hCG blood, ED ([facility], [facility], [facility] only)     Status: None   Collection Time: 06/22/19  4:09 AM  Result Value Ref Range   I-stat hCG, quantitative <5.0 <5 mIU/mL   Comment 3            Comment:   GEST. AGE      CONC.  (mIU/mL)   <=1 WEEK        5 - 50     2 WEEKS       50 - 500     3 WEEKS       100 - 10,000     4 WEEKS     1,000 - 30,000        FEMALE AND NON-PREGNANT FEMALE:     LESS THAN 5 mIU/mL   I-stat chem 8, ED (not at [facility] or [facility])     Status: Abnormal   Collection Time: 06/22/19  4:10 AM  Result Value Ref Range   Sodium 134 (L) 135 - 145 mmol/L   Potassium 3.5 3.5 - 5.1 mmol/L   Chloride 101 98 - 111 mmol/L   BUN 6 6 - 20 mg/dL   Creatinine, Ser 7.820.80 0.44 - 1.00 mg/dL   Glucose, Bld 956120 (H) 70 - 99 mg/dL   Calcium, Ion 2.131.08 (L) 1.15 - 1.40 mmol/L   TCO2 22 22 - 32 mmol/L   Hemoglobin 13.3 12.0 - 15.0 g/dL   HCT 08.639.0 57.836.0 -  46.946.0 %  SARS Coronavirus 2 (CEPHEID - Performed in [facility] hospital lab), Hosp Order     Status: None   Collection Time: 06/22/19  4:10 AM   Specimen: Nasopharyngeal Swab  Result Value Ref Range   SARS Coronavirus 2 NEGATIVE NEGATIVE    Comment: (NOTE) If result is NEGATIVE SARS-CoV-2 target nucleic acids are NOT DETECTED. The SARS-CoV-2 RNA is generally detectable in upper and lower  respiratory specimens during the acute phase of infection. The lowest  concentration of SARS-CoV-2 viral copies this assay can detect is 250  copies / mL. A negative result does not preclude SARS-CoV-2 infection  and should not be used as the sole basis for treatment or other  patient management decisions.  A negative result may occur with  improper specimen collection / handling, submission of  specimen other  than nasopharyngeal swab, presence of viral mutation(s) within the  areas targeted by this assay, and inadequate number of viral copies  (<250 copies / mL). A negative result must be combined with clinical  observations, patient history, and epidemiological information. If result is POSITIVE SARS-CoV-2 target nucleic acids are DETECTED. The SARS-CoV-2 RNA is generally detectable in upper and lower  respiratory specimens dur ing the acute phase of infection.  Positive  results are indicative of active infection with SARS-CoV-2.  Clinical  correlation with patient history and other diagnostic information is  necessary to determine patient infection status.  Positive results do  not rule out bacterial infection or co-infection with other viruses. If result is PRESUMPTIVE POSTIVE SARS-CoV-2 nucleic acids MAY BE PRESENT.   A presumptive positive result was obtained on the submitted specimen  and confirmed on repeat testing.  While 2019 novel coronavirus  (SARS-CoV-2) nucleic acids may be present in the submitted sample  additional confirmatory testing may be necessary for epidemiological  and /  or clinical management purposes  to differentiate between  SARS-CoV-2 and other Sarbecovirus currently known to infect humans.  If clinically indicated additional testing with an alternate test  methodology [phone]) is advised. The SARS-CoV-2 RNA is generally  detectable in upper and lower respiratory sp ecimens during the acute  phase of infection. The expected result is Negative. Fact Sheet for Patients:  BoilerBrush.com.cyhttps://www.fda.gov/media/136312/download Fact Sheet for Healthcare Providers: https://pope.com/https://www.fda.gov/media/136313/download This test is not yet approved or cleared by the [place] FDA and has been authorized for detection and/or diagnosis of SARS-CoV-2 by FDA under an Emergency Use Authorization (EUA).  This EUA will remain in effect (meaning this test can be used) for the duration of the COVID-19 declaration under Section 564(b)(1) of the Act, 21 U.S.C. section 360bbb-3(b)(1), unless the authorization is terminated or revoked sooner. Performed at [facility], [address] [place], [place] [address]    No results found.  Pending Labs Unresulted Labs (From admission, onward)    Start     Ordered   06/22/19 0552  Hepatitis panel, acute  ONCE - STAT,   STAT     06/22/19 0551   06/22/19 0517  TSH  ONCE - STAT,   STAT     06/22/19 0535          Vitals/Pain Today's Vitals   06/22/19 0400 06/22/19 0500 06/22/19 0515 06/22/19 0545  BP:  110/70 107/63 103/69  Pulse:  80 78 77  Resp:  12 14 16   Temp: 98.4 F (36.9 C)     TempSrc: Oral     SpO2:  96% 97% 100%  Weight:      Height:      PainSc:        Isolation Precautions No active isolations  Medications Medications  0.9 %  sodium chloride infusion ( Intravenous New Bag/Given 06/22/19 0507)  acetylcysteine (ACETADOTE) 40 mg/mL load via infusion 8,850 mg (has no administration in time range)    Followed by  acetylcysteine (ACETADOTE) 12,000 mg in dextrose 5 % 300 mL (40 mg/mL) infusion (has no  administration in time range)    Mobility walks Low fall risk   Focused Assessments -   R Recommendations: See Admitting Provider Note  Report given to:   Additional Notes:

## 2019-06-23 ENCOUNTER — Other Ambulatory Visit: Payer: Self-pay | Admitting: Internal Medicine

## 2019-06-23 ENCOUNTER — Encounter (HOSPITAL_COMMUNITY): Payer: Self-pay | Admitting: Physician Assistant

## 2019-06-23 DIAGNOSIS — T391X4A Poisoning by 4-Aminophenol derivatives, undetermined, initial encounter: Secondary | ICD-10-CM

## 2019-06-23 DIAGNOSIS — B159 Hepatitis A without hepatic coma: Secondary | ICD-10-CM

## 2019-06-23 DIAGNOSIS — M797 Fibromyalgia: Secondary | ICD-10-CM

## 2019-06-23 DIAGNOSIS — T39094A Poisoning by salicylates, undetermined, initial encounter: Secondary | ICD-10-CM

## 2019-06-23 DIAGNOSIS — R112 Nausea with vomiting, unspecified: Secondary | ICD-10-CM

## 2019-06-23 DIAGNOSIS — Z86718 Personal history of other venous thrombosis and embolism: Secondary | ICD-10-CM

## 2019-06-23 DIAGNOSIS — R945 Abnormal results of liver function studies: Secondary | ICD-10-CM

## 2019-06-23 LAB — CBC
HCT: 32.1 % — ABNORMAL LOW (ref 36.0–46.0)
Hemoglobin: 11.3 g/dL — ABNORMAL LOW (ref 12.0–15.0)
MCH: 29.7 pg (ref 26.0–34.0)
MCHC: 35.2 g/dL (ref 30.0–36.0)
MCV: 84.3 fL (ref 80.0–100.0)
Platelets: 313 10*3/uL (ref 150–400)
RBC: 3.81 MIL/uL — ABNORMAL LOW (ref 3.87–5.11)
RDW: 20.6 % — ABNORMAL HIGH (ref 11.5–15.5)
WBC: 5.7 10*3/uL (ref 4.0–10.5)
nRBC: 0 % (ref 0.0–0.2)

## 2019-06-23 LAB — BASIC METABOLIC PANEL
Anion gap: 11 (ref 5–15)
BUN: <5 mg/dL — ABNORMAL LOW (ref 6–20)
CO2: 20 mmol/L — ABNORMAL LOW (ref 22–32)
Calcium: 8.7 mg/dL — ABNORMAL LOW (ref 8.9–10.3)
Chloride: 106 mmol/L (ref 98–111)
Creatinine, Ser: 0.61 mg/dL (ref 0.44–1.00)
GFR calc Af Amer: >60 mL/min (ref >60)
GFR calc non Af Amer: >60 mL/min (ref >60)
Glucose, Bld: 99 mg/dL (ref 70–99)
Potassium: 3.7 mmol/L (ref 3.5–5.1)
Sodium: 137 mmol/L (ref 135–145)

## 2019-06-23 LAB — COMPREHENSIVE METABOLIC PANEL
ALT: 492 U/L — ABNORMAL HIGH (ref 0–44)
AST: 760 U/L — ABNORMAL HIGH (ref 15–41)
Albumin: 2.1 g/dL — ABNORMAL LOW (ref 3.5–5.0)
Alkaline Phosphatase: 99 U/L (ref 38–126)
Anion gap: 8 (ref 5–15)
BUN: <5 mg/dL — ABNORMAL LOW (ref 6–20)
CO2: 23 mmol/L (ref 22–32)
Calcium: 8.6 mg/dL — ABNORMAL LOW (ref 8.9–10.3)
Chloride: 108 mmol/L (ref 98–111)
Creatinine, Ser: 0.77 mg/dL (ref 0.44–1.00)
GFR calc Af Amer: >60 mL/min (ref >60)
GFR calc non Af Amer: >60 mL/min (ref >60)
Glucose, Bld: 80 mg/dL (ref 70–99)
Potassium: 4.4 mmol/L (ref 3.5–5.1)
Sodium: 139 mmol/L (ref 135–145)
Total Bilirubin: 14.9 mg/dL — ABNORMAL HIGH (ref 0.3–1.2)
Total Protein: 6.5 g/dL (ref 6.5–8.1)

## 2019-06-23 LAB — HEPATITIS PANEL, ACUTE
HCV Ab: <0.1 s/co ratio (ref 0.0–0.9)
Hep A IgM: POSITIVE — AB
Hep B C IgM: NEGATIVE
Hepatitis B Surface Ag: NEGATIVE

## 2019-06-23 LAB — APTT: aPTT: 34 seconds (ref 24–36)

## 2019-06-23 LAB — HIV ANTIBODY (ROUTINE TESTING W REFLEX): HIV Screen 4th Generation wRfx: NONREACTIVE

## 2019-06-23 LAB — ACETAMINOPHEN LEVEL: Acetaminophen (Tylenol), Serum: <10 ug/mL — ABNORMAL LOW (ref 10–30)

## 2019-06-23 LAB — PROTIME-INR
INR: 1.5 — ABNORMAL HIGH (ref 0.8–1.2)
Prothrombin Time: 17.7 seconds — ABNORMAL HIGH (ref 11.4–15.2)

## 2019-06-23 NOTE — Progress Notes (Signed)
Patient ID: Barbara Moses, female   DOB: 1985/12/27, 33 y.o.   MRN: [id number]  PROGRESS NOTE    Barbara ComeKelly E Kisling  WUJ:[id number] DOB: 1985/12/27 DOA: 06/22/2019 PCP: Ardyth GalYbanez, Jane, MD   Brief Narrative:  33 year old female with history of fibromyalgia, hypothyroidism, DVT, anemia and anxiety presented on 06/22/2019 with right leg pain/right flank pain with nausea, vomiting, epigastric pressure and discomfort.  Patient has been taking daily aspirin for 7 to 8 years along with recent intake of Goody powders.  She was found to have elevated LFTs including total bilirubin of 18.1.  Ultrasound abdomen showed mildly dilated common bile duct of 7 cm which can be seen after cholecystectomy.  She had acetaminophen level of 18 and salicylate level of 15.4 Poison control was contacted and she was started on normal saline and N-acetylcysteine.  Assessment & Plan:   Probable acetaminophen and salicylate overdose  -Patient has been on aspirin for 7 to 8 years and has been recently taking Goody powders  -Presented with acetaminophen level of 18 and salicylate level of 15.4. -Poison control recommended serial monitoring of BMP and salicylate levels and daily liver function test.  She was started on IV fluids and N-acetylcysteine as per poison control recommendations. -Subsequent Tylenol level is less than 10.  Poison control has recommended that N-acetylcysteine be discontinued.  Will DC N-acetylcysteine. -LFTs are improving  Acute transaminitis along with hyperbilirubinemia  -LFTs are improving.  Presented with bilirubin of 18.1.  Bilirubin 14.9 today.  AST and ALT are also improving.  INR is only 1.5.  Patient is alert and awake and oriented. -Right upper quadrant ultrasound showed mildly dilated CBD which could be normal postcholecystectomy -Monitor LFTs.  Spoke to Dr. Brennan BaileyGessner/GI who will evaluate the patient in consultation.  Follow hepatitis panel  Nausea and vomiting with epigastric discomfort -Probably  from above.  Improving.  Advance diet.  Antiemetics as needed -Continue Protonix  Subclinical hypothyroidism  -TSH 4.858 on admission with free T4 0.64  History of DVT -Hyperlipidemia has been on aspirin for treatment of DVT for the last 7 to 8 years after birth of her son.  Hold aspirin for now.  Outpatient follow-up with PCP   Fibromyalgia Migraine headaches -Avoid medications with Tylenol.  Outpatient follow-up    DVT prophylaxis: SCD Code Status: Full  family Communication: None at bedside Disposition Plan: Home in 2 to 3 days once cleared by GI  Consultants: Poison control/GI Procedures: None  Antimicrobials: None   Subjective: Patient seen and examined at bedside.  She denies any current nausea or vomiting.  No worsening abdominal pain.  No overnight fever.  Objective: Vitals:   06/22/19 1958 06/23/19 0035 06/23/19 0444 06/23/19 0446  BP: 107/81 106/72 99/68   Pulse: 80 71 72   Resp: 20 20 20    Temp: 98.5 F (36.9 C) 99 F (37.2 C) 98.7 F (37.1 C)   TempSrc: Oral Oral Oral   SpO2: 99% 100% 97%   Weight:    57.9 kg  Height:        Intake/Output Summary (Last 24 hours) at 06/23/2019 1200 Last data filed at 06/23/2019 1005 Gross per 24 hour  Intake 6026.99 ml  Output 4300 ml  Net 1726.99 ml   Filed Weights   06/22/19 0357 06/22/19 0815 06/23/19 0446  Weight: 59 kg 57.7 kg 57.9 kg    Examination:  General exam: Appears calm and comfortable.  No distress.  Looks icteric. Respiratory system: Bilateral decreased breath sounds at bases Cardiovascular system:  S1 & S2 heard, Rate controlled Gastrointestinal system: Abdomen is nondistended, soft and nontender. Normal bowel sounds heard. Extremities: No cyanosis, clubbing, edema  Central nervous system: Alert and oriented. No focal neurological deficits. Moving extremities Skin: No rashes, lesions or ulcers Psychiatry: Judgement and insight appear normal. Mood & affect appropriate.     Data Reviewed: I  have personally reviewed following labs and imaging studies  CBC: Recent Labs  Lab 06/22/19 0355 06/22/19 0410 06/23/19 0608  WBC 7.9  --  5.7  NEUTROABS 4.5  --   --   HGB 11.5* 13.3 11.3*  HCT 34.2* 39.0 32.1*  MCV 87.7  --  84.3  PLT 364  --  313   Basic Metabolic Panel: Recent Labs  Lab 06/22/19 1408 06/22/19 1815 06/22/19 2046 06/22/19 2344 06/23/19 0608  NA 137 136 136 137 139  K 3.3* 3.5 4.1 3.7 4.4  CL 105 105 105 106 108  CO2 22 23 23  20* 23  GLUCOSE 112* 101* 80 99 80  BUN <5* <5* <5* <5* <5*  CREATININE 0.71 0.64 0.70 0.61 0.77  CALCIUM 8.5* 8.3* 8.6* 8.7* 8.6*   GFR: Estimated Creatinine Clearance: 79.1 mL/min (by C-G formula based on SCr of 0.77 mg/dL). Liver Function Tests: Recent Labs  Lab 06/22/19 0355 06/23/19 0608  AST 883* 760*  ALT 620* 492*  ALKPHOS 129* 99  BILITOT 18.1* 14.9*  PROT 8.4* 6.5  ALBUMIN 2.8* 2.1*   No results for input(s): LIPASE, AMYLASE in the last 168 hours. Recent Labs  Lab 06/22/19 0856  AMMONIA 36*   Coagulation Profile: Recent Labs  Lab 06/22/19 0355 06/23/19 0608  INR 1.2 1.5*   Cardiac Enzymes: No results for input(s): CKTOTAL, CKMB, CKMBINDEX, TROPONINI in the last 168 hours. BNP (last 3 results) No results for input(s): PROBNP in the last 8760 hours. HbA1C: No results for input(s): HGBA1C in the last 72 hours. CBG: No results for input(s): GLUCAP in the last 168 hours. Lipid Profile: No results for input(s): CHOL, HDL, LDLCALC, TRIG, CHOLHDL, LDLDIRECT in the last 72 hours. Thyroid Function Tests: Recent Labs    06/22/19 0517 06/22/19 0856  TSH 4.858*  --   FREET4  --  0.64   Anemia Panel: No results for input(s): VITAMINB12, FOLATE, FERRITIN, TIBC, IRON, RETICCTPCT in the last 72 hours. Sepsis Labs: No results for input(s): PROCALCITON, LATICACIDVEN in the last 168 hours.  Recent Results (from the past 240 hour(s))  SARS Coronavirus 2 (CEPHEID - Performed in [facility] hospital lab),  Hosp Order     Status: None   Collection Time: 06/22/19  4:10 AM   Specimen: Nasopharyngeal Swab  Result Value Ref Range Status   SARS Coronavirus 2 NEGATIVE NEGATIVE Final    Comment: (NOTE) If result is NEGATIVE SARS-CoV-2 target nucleic acids are NOT DETECTED. The SARS-CoV-2 RNA is generally detectable in upper and lower  respiratory specimens during the acute phase of infection. The lowest  concentration of SARS-CoV-2 viral copies this assay can detect is 250  copies / mL. A negative result does not preclude SARS-CoV-2 infection  and should not be used as the sole basis for treatment or other  patient management decisions.  A negative result may occur with  improper specimen collection / handling, submission of specimen other  than nasopharyngeal swab, presence of viral mutation(s) within the  areas targeted by this assay, and inadequate number of viral copies  (<250 copies / mL). A negative result must be combined with clinical  observations,  patient history, and epidemiological information. If result is POSITIVE SARS-CoV-2 target nucleic acids are DETECTED. The SARS-CoV-2 RNA is generally detectable in upper and lower  respiratory specimens dur ing the acute phase of infection.  Positive  results are indicative of active infection with SARS-CoV-2.  Clinical  correlation with patient history and other diagnostic information is  necessary to determine patient infection status.  Positive results do  not rule out bacterial infection or co-infection with other viruses. If result is PRESUMPTIVE POSTIVE SARS-CoV-2 nucleic acids MAY BE PRESENT.   A presumptive positive result was obtained on the submitted specimen  and confirmed on repeat testing.  While 2019 novel coronavirus  (SARS-CoV-2) nucleic acids may be present in the submitted sample  additional confirmatory testing may be necessary for epidemiological  and / or clinical management purposes  to differentiate between   SARS-CoV-2 and other Sarbecovirus currently known to infect humans.  If clinically indicated additional testing with an alternate test  methodology [phone]) is advised. The SARS-CoV-2 RNA is generally  detectable in upper and lower respiratory sp ecimens during the acute  phase of infection. The expected result is Negative. Fact Sheet for Patients:  StrictlyIdeas.no Fact Sheet for Healthcare Providers: BankingDealers.co.za This test is not yet approved or cleared by the [place] FDA and has been authorized for detection and/or diagnosis of SARS-CoV-2 by FDA under an Emergency Use Authorization (EUA).  This EUA will remain in effect (meaning this test can be used) for the duration of the COVID-19 declaration under Section 564(b)(1) of the Act, 21 U.S.C. section 360bbb-3(b)(1), unless the authorization is terminated or revoked sooner. Performed at [facility], [place] [address], [place], [place] [address]          Radiology Studies: US Abdomen Limited  Result Date: 06/22/2019 CLINICAL DATA:  Abdominal pain EXAM: ULTRASOUND ABDOMEN LIMITED RIGHT UPPER QUADRANT COMPARISON:  None available FINDINGS: Gallbladder: History of cholecystectomy Common bile duct: Diameter: 7 mm.  Where visualized, no filling defect. Liver: No focal lesion identified. Within normal limits in parenchymal echogenicity. Portal vein is patent on color Doppler imaging with normal direction of blood flow towards the liver. IMPRESSION: Mildly dilated common bile duct that is usually reservoir effect after cholecystectomy. Follow-up could be obtained depending on trend of liver function tests. Electronically Signed   By: Monte Fantasia M.D.   On: 06/22/2019 06:54        Scheduled Meds: . pantoprazole (PROTONIX) IV  40 mg Intravenous Q12H  . sodium chloride flush  3 mL Intravenous Q12H   Continuous Infusions: . sodium chloride 125 mL/hr at 06/23/19 0957      LOS: 1 day        Aline August, MD [facility] 06/23/2019, 12:00 PM

## 2019-06-23 NOTE — Consult Note (Addendum)
[facility] Consult: 12:41 PM 06/23/2019  LOS: 1 day    Referring Provider: Dr Starla Link  Primary Care Physician:  Concepcion Elk, MD in [facility]. Primary Gastroenterologist:  Seen x 1 in office 2012, Dr Ardis Hughs.  [facility] in 10/2013.      Reason for Consultation: Abdominal pain.  Tylenol liver injury   HPI: Barbara Moses is a 33 y.o. female.  PMH Anxiety.  Fibromyalgia.  Hypothyroidism.  Migraine.  Anemia.  Protein S deficiency.  Right gonadal vein DVT treated with 6 months Lovenox, Dr. Marin Olp recommended low-dose aspirin after this.  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   Laparoscopic cholecystectomy ~ 2006.    04/2011 MRI/MRCP for evaluation of chest pain similar to previous biliary symptoms prior to cholecystectomy, normal LFTs.  [redacted] weeks pregnant at the time.Study showed slight fusiform appearance to the CBD, measured 6 mm.  No filling defect.  Previous cholecystectomy.  Small cystic duct remnant present.  Dorsal pancreatic duct normal.  No pancreas divisum. 09/2011 CTAP for abdominal pain, N/V.  Acute/subacute thrombus in the right gonadal vein most C/W thrombophlebitis. 12/2010 CTAP to follow-up thrombus.  Right ovarian vein thrombus/thrombophlebitis resolved. 12/11/2011 CT Angio abd/pelvis for left thigh pain.  No venous thrombus.  No acute intra-abdominal pathology. 05/2013 pelvic ultrasound for pelvic pain.  Normal study. 05/2013 renal ultrasound for chronic right abdominal pain.  Negative exam.   Taking Rxd and OTC analgesics for > year, to address muscle/skeletal pain Goody powders, 3/day,  containing 327m  ASA and 5083macetaminophen each.  Says she is not taking "plain" Acetaminophen.  Oxycodone, dose unknown, up to 3 x day but not every day.  No ETOH. No new meds, no herbal supplements.       Resented and admitted with jaundice 06/22/2019.  Had noticed yellowing of her eyes develop after several days of epigastric abdominal pain and non-bloody n/v.  The NV and abdominal pain resolved yesterday, she is tolerating clears.  Pain not familiar.  No sick contacts. Not currently working outside the home.  No exposure to restaurant food.   Prior to these acute symptoms, she was not having reflux symptoms, nausea, anorexia, abdominal pain.  Hepatitis A IgM is positive.  Hep B surface Ag and core Ab IgM, HCV are all negative.   T bili 18.1 (direct 12.7) >>> 14.9.  Alkaline phosphatase 129 >> 99.  AST/ALT 883/620 >> 760/492. APAP level elevated at 18 >> less than 10 on repeat x 2 after Acetylcysteine protocol initiated, discontinued after 1 dose yesterday.   PT/INR15.5/1.2 >> 17.7/1.5.   06/22/2019 abdominal ultrasound.  CBD 7 mm, no filling defect.  Fm Hx: Dad had Crohn's dz and DVT.  No family hx liver or GB disease.  Social: Single.  Lives with her 8 66nd 1274r old children.  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   Past Medical History:  Diagnosis Date  . Anemia   . Anxiety   . BV (bacterial vaginosis)   . Cervix, short (affecting pregnancy)  03/28/2011  . Common migraine 01/18/2015  . DVT (deep venous thrombosis) ([place])   . Family history of DVT 12/17/2011  . Fibromyalgia   . Gallstones   . Hypothyroidism   . Thyroid condition     Past Surgical History:  Procedure Laterality Date  . CHOLECYSTECTOMY, LAPAROSCOPIC    . WISDOM TOOTH EXTRACTION      Prior to Admission medications   Medication Sig Start Date End Date Taking? Authorizing Provider  acetaminophen (TYLENOL) 500 MG tablet Take 1,500 mg by mouth every 6 (six) hours as needed. For pain   Yes [provider]  Aspirin-Salicylamide-Caffeine (BC HEADACHE POWDER PO) Take 1 packet by mouth as needed (for pain).   Yes [provider]  albuterol (PROVENTIL HFA;VENTOLIN HFA) 108 (90 BASE) MCG/ACT  inhaler Inhale 1-2 puffs into the lungs every 6 (six) hours as needed for wheezing or shortness of breath. Patient not taking: Reported on 06/22/2019 08/18/15   Palumbo, April, MD  benzonatate (TESSALON) 100 MG capsule Take 1 capsule (100 mg total) by mouth every 8 (eight) hours. Patient not taking: Reported on 06/22/2019 08/18/15   Palumbo, April, MD  ibuprofen (ADVIL,MOTRIN) 800 MG tablet Take 1 tablet (800 mg total) by mouth 3 (three) times daily. Patient not taking: Reported on 06/22/2019 05/24/13   Fransico Meadow, PA-C  loratadine (CLARITIN) 10 MG tablet Take 1 tablet (10 mg total) by mouth daily. Patient not taking: Reported on 06/22/2019 08/18/15   Palumbo, April, MD  Spacer/Aero-Holding Chambers (E-Z SPACER) inhaler Use as instructed 08/18/15   Palumbo, April, MD  SUMAtriptan (IMITREX) 100 MG tablet Take 1 tablet (100 mg total) by mouth 2 (two) times daily as needed for migraine or headache. Patient not taking: Reported on 06/22/2019 01/18/15   Kathrynn Ducking, MD    Scheduled Meds: . sodium chloride flush  3 mL Intravenous Q12H   Infusions: . sodium chloride 100 mL/hr at 06/23/19 1232   PRN Meds: albuterol, ondansetron **OR** ondansetron (ZOFRAN) IV   Allergies as of 06/22/2019 - Review Complete 06/22/2019  Allergen Reaction Noted  . Penicillins Nausea And Vomiting 10/24/2012  . Tramadol Itching 10/24/2012    Family History  Problem Relation Age of Onset  . Fibroids Mother   . Diabetes Father   . Crohn's disease Father   . Cancer Maternal Grandmother     Social History   Social History Narrative   Daily caffeine   Patient is right handed   Patient lives with mom and 2 sons. She is single   Smoker, no EtOH     REVIEW OF SYSTEMS: Constitutional: Fatigue in recent days but generally not an issue. ENT:  No nose bleeds Pulm: No shortness of breath.  No cough. CV:  No palpitations, no LE edema.  No chest pain. GU:  No hematuria, no frequency.  Urine was dark but it is  beginning to become normal, yellow-colored. GI: See HPI. Heme: Denies unusual or excessive bleeding or bruising. Transfusions: None Neuro: Occasional headaches.  No syncope.  No seizures.  No peripheral tingling or numbness Derm: Jaundice as per HPI.  No itching, no rash or sores.  Endocrine:  No sweats or chills.  No polyuria or dysuria Immunization: Viewed. Travel:  None beyond local counties in last few months.    PHYSICAL EXAM: Vital signs in last 24 hours: Vitals:   06/23/19 0035 06/23/19 0444  BP: 106/72 99/68  Pulse: 71 72  Resp: 20 20  Temp: 99 F (37.2 C) 98.7 F (37.1  C)  SpO2: 100% 97%   Wt Readings from Last 3 Encounters:  06/23/19 57.9 kg  08/22/16 59 kg  08/18/15 59 kg    General: Pleasant, comfortable, sleepy WF.  Does not look unwell.  Jaundice is actually hard to appreciate as her skin is tanned. Head: No facial asymmetry or swelling.  No signs of head trauma. Eyes: Mild icterus.  EOMI. Ears: Not hard of hearing Nose: No congestion.  No discharge. Mouth: Moist, clear, pink oral mucosa.  Tongue midline. Neck: No JVD, no masses, no thyromegaly. Lungs: Clear bilaterally.  No labored breathing or cough. Heart: RRR.  No MRG.  S1, S2 present Abdomen: Soft.  Not tender.  Not distended.  No HSM, masses, bruits, hernias.  Active bowel sounds..   Rectal: Deferred Musc/Skeltl: No joint deformity or swelling. Extremities: No CCE. Neurologic: Alert.  Oriented x3.  No asterixis or tremor.  No limb weakness.  No gross neurologic deficits. Skin: Tanned.  Hard to appreciate the jaundice. Nodes: No cervical adenopathy. Psych: Pleasant, cooperative, fluid speech.  Intake/Output from previous day: 07/15 0701 - 07/16 0700 In: 5547 [P.O.:3022; I.V.:2525] Out: 3700 [Urine:3700] Intake/Output this shift: Total I/O In: 480 [P.O.:480] Out: 600 [Urine:600]  LAB RESULTS: Recent Labs    06/22/19 0355 06/22/19 0410 06/23/19 0608  WBC 7.9  --  5.7  HGB 11.5* 13.3  11.3*  HCT 34.2* 39.0 32.1*  PLT 364  --  313   BMET Lab Results  Component Value Date   NA 139 06/23/2019   NA 137 06/22/2019   NA 136 06/22/2019   K 4.4 06/23/2019   K 3.7 06/22/2019   K 4.1 06/22/2019   CL 108 06/23/2019   CL 106 06/22/2019   CL 105 06/22/2019   CO2 23 06/23/2019   CO2 20 (L) 06/22/2019   CO2 23 06/22/2019   GLUCOSE 80 06/23/2019   GLUCOSE 99 06/22/2019   GLUCOSE 80 06/22/2019   BUN <5 (L) 06/23/2019   BUN <5 (L) 06/22/2019   BUN <5 (L) 06/22/2019   CREATININE 0.77 06/23/2019   CREATININE 0.61 06/22/2019   CREATININE 0.70 06/22/2019   CALCIUM 8.6 (L) 06/23/2019   CALCIUM 8.7 (L) 06/22/2019   CALCIUM 8.6 (L) 06/22/2019   LFT Recent Labs    06/22/19 0355 06/23/19 0608  PROT 8.4* 6.5  ALBUMIN 2.8* 2.1*  AST 883* 760*  ALT 620* 492*  ALKPHOS 129* 99  BILITOT 18.1* 14.9*  BILIDIR 12.7*  --   IBILI NOT CALCULATED  --    PT/INR Lab Results  Component Value Date   INR 1.5 (H) 06/23/2019   INR 1.2 06/22/2019   INR 1.3 (L) 09/08/2011   PROTIME 15.6 (H) 09/08/2011   Hepatitis Panel Recent Labs    06/22/19 0552  HEPBSAG Negative  HCVAB <0.1  HEPAIGM Positive*  HEPBIGM Negative   Lipase     Component Value Date/Time   LIPASE 83 03/30/2011 0120    Drugs of Abuse     Component Value Date/Time   LABOPIA POSITIVE (A) 06/22/2019 0401   COCAINSCRNUR NONE DETECTED 06/22/2019 0401   LABBENZ NONE DETECTED 06/22/2019 0401   AMPHETMU NONE DETECTED 06/22/2019 0401   THCU NONE DETECTED 06/22/2019 0401   LABBARB NONE DETECTED 06/22/2019 0401     RADIOLOGY STUDIES: US Abdomen Limited  Result Date: 06/22/2019 CLINICAL DATA:  Abdominal pain EXAM: ULTRASOUND ABDOMEN LIMITED RIGHT UPPER QUADRANT COMPARISON:  None available FINDINGS: Gallbladder: History of cholecystectomy Common bile duct: Diameter: 7 mm.  Where visualized,  no filling defect. Liver: No focal lesion identified. Within normal limits in parenchymal echogenicity. Portal vein is  patent on color Doppler imaging with normal direction of blood flow towards the liver. IMPRESSION: Mildly dilated common bile duct that is usually reservoir effect after cholecystectomy. Follow-up could be obtained depending on trend of liver function tests. Electronically Signed   By: Monte Fantasia M.D.   On: 06/22/2019 06:54     IMPRESSION:   *   Jaundice with abd pain and N/V.   Hep A IgM is positive.  Initial APAP 18 and <10 1 dose of acetylcysteine.  Habitual use of acetaminophen-containing analgesics but does not sound like she is taking toxic levels.  Sxs resolved.  LFTs improving.  Cholecystectomy ~ 2006 Her social history/review of systems not revealing for source of hepatitis A infection.   PLAN:     *   Supportive care.  C-Met in the morning. Agree with advancing diet.  Soft diet ordered earlier this morning.  *    Stopped the IV Protonix since her nausea and vomiting have resolved and prior to recent acute symptoms she had little in the way of reflux.   Azucena Freed  06/23/2019, 12:41 PM Phone [phone]    [place] Attending   I have taken an interval history, reviewed the chart and examined the patient. I agree with the Advanced Practitioner's note, impression and recommendations.    She has acute hepatitis A in the setting of chronic salicylate and acetaminophen use which are not the problem.  A full recovery is expected with supportive care.   She can go home tomorrow, I have attached hepatitis A information to her discharge instructions.  I have her come to my office for labs on the day that will be specified in the discharge instructions.  I will coordinate further follow-up after that if necessary though lab and phone contact may be all that is necessary.  She should reduce her use of salicylates and acetaminophen because there are potential chronic problems with that.  Gatha Mayer, MD, [facility] 06/23/2019 6:38 PM Pager  [phone]

## 2019-06-23 NOTE — Progress Notes (Signed)
Pharmacy note: IV acetylcysteine  33 yo female with concern of Tylenol overdose on IV acetylcysteine -INR 1.5; AST/ALT 720/492 (trend down), t bili= 14.9 (down) -APAP 18>> less than 10   Spoke to Poison Control: may discontinue acetylcysteine  Plan -discontinue acetylcysteine (also spoke with Dr. Starla Link)  Hildred Laser, PharmD Clinical Pharmacist **Pharmacist phone directory can now be found on [email or web address] ([email or web address]).  Listed under [place].

## 2019-06-24 DIAGNOSIS — E039 Hypothyroidism, unspecified: Secondary | ICD-10-CM

## 2019-06-24 DIAGNOSIS — B159 Hepatitis A without hepatic coma: Secondary | ICD-10-CM

## 2019-06-24 LAB — COMPREHENSIVE METABOLIC PANEL
ALT: 481 U/L — ABNORMAL HIGH (ref 0–44)
AST: 858 U/L — ABNORMAL HIGH (ref 15–41)
Albumin: 2 g/dL — ABNORMAL LOW (ref 3.5–5.0)
Alkaline Phosphatase: 104 U/L (ref 38–126)
Anion gap: 6 (ref 5–15)
BUN: <5 mg/dL — ABNORMAL LOW (ref 6–20)
CO2: 23 mmol/L (ref 22–32)
Calcium: 8.6 mg/dL — ABNORMAL LOW (ref 8.9–10.3)
Chloride: 107 mmol/L (ref 98–111)
Creatinine, Ser: 0.8 mg/dL (ref 0.44–1.00)
GFR calc Af Amer: >60 mL/min (ref >60)
GFR calc non Af Amer: >60 mL/min (ref >60)
Glucose, Bld: 81 mg/dL (ref 70–99)
Potassium: 4 mmol/L (ref 3.5–5.1)
Sodium: 136 mmol/L (ref 135–145)
Total Bilirubin: 16.2 mg/dL — ABNORMAL HIGH (ref 0.3–1.2)
Total Protein: 6.5 g/dL (ref 6.5–8.1)

## 2019-06-24 LAB — CBC WITH DIFFERENTIAL/PLATELET
Abs Immature Granulocytes: 0.02 10*3/uL (ref 0.00–0.07)
Basophils Absolute: 0.1 10*3/uL (ref 0.0–0.1)
Basophils Relative: 1 %
Eosinophils Absolute: 0.4 10*3/uL (ref 0.0–0.5)
Eosinophils Relative: 7 %
HCT: 32.3 % — ABNORMAL LOW (ref 36.0–46.0)
Hemoglobin: 11.3 g/dL — ABNORMAL LOW (ref 12.0–15.0)
Immature Granulocytes: 0 %
Lymphocytes Relative: 35 %
Lymphs Abs: 2 10*3/uL (ref 0.7–4.0)
MCH: 29.7 pg (ref 26.0–34.0)
MCHC: 35 g/dL (ref 30.0–36.0)
MCV: 84.8 fL (ref 80.0–100.0)
Monocytes Absolute: 0.6 10*3/uL (ref 0.1–1.0)
Monocytes Relative: 10 %
Neutro Abs: 2.6 10*3/uL (ref 1.7–7.7)
Neutrophils Relative %: 47 %
Platelets: 333 10*3/uL (ref 150–400)
RBC: 3.81 MIL/uL — ABNORMAL LOW (ref 3.87–5.11)
RDW: 21 % — ABNORMAL HIGH (ref 11.5–15.5)
WBC: 5.6 10*3/uL (ref 4.0–10.5)
nRBC: 0 % (ref 0.0–0.2)

## 2019-06-24 LAB — PROTIME-INR
INR: 1.4 — ABNORMAL HIGH (ref 0.8–1.2)
Prothrombin Time: 17.1 seconds — ABNORMAL HIGH (ref 11.4–15.2)

## 2019-06-24 LAB — MAGNESIUM: Magnesium: 1.7 mg/dL (ref 1.7–2.4)

## 2019-06-24 MED ORDER — ONDANSETRON HCL 4 MG PO TABS: 4.0000 mg | ORAL_TABLET | Freq: Four times a day (QID) | ORAL | 0 refills | Status: AC | PRN

## 2019-06-24 NOTE — Progress Notes (Signed)
Discussed discharge instructions with patient including medications and follow up appointments  

## 2019-06-24 NOTE — Discharge Summary (Signed)
Physician Discharge Summary  Barbara Moses ZOX:[id number] DOB: [birth year] DOA: 06/22/2019  PCP: Ardyth Gal, MD  Admit date: 06/22/2019 Discharge date: 06/24/2019  Admitted From: Home Disposition: Home  Recommendations for Outpatient Follow-up:  1. Follow up with PCP in 1 week  2. Follow-up with GI within a week with repeat CMP 3. Follow up in ED if symptoms worsen or new appear   Home Health: No Equipment/Devices: None  Discharge Condition: Stable CODE STATUS: Full Diet recommendation: Regular  Brief/Interim Summary: 33 year old female with history of fibromyalgia, hypothyroidism, DVT, anemia and anxiety presented on 06/22/2019 with right leg pain/right flank pain with nausea, vomiting, epigastric pressure and discomfort.  Patient has been taking daily aspirin for 7 to 8 years along with recent intake of Goody powders.  She was found to have elevated LFTs including total bilirubin of 18.1.  Ultrasound abdomen showed mildly dilated common bile duct of 7 cm which can be seen after cholecystectomy.  She had acetaminophen level of 18 and salicylate level of 15.4 Poison control was contacted and she was started on normal saline and N-acetylcysteine.  N-acetylcysteine was stopped as per poison control recommendations for subsequent Tylenol level was less than 10.  GI was consulted for abnormal LFTs.  She was found to have hepatitis A IgM positive.  It was thought that her symptoms were probably because of acute hepatitis A.  Her symptoms have improved.  She is tolerating diet.  GI has cleared the patient for discharge with repeat CMP next week.  Discharge patient home.  Discharge Diagnoses:   Acute hepatitis A causing acute transaminitis along with hyperbilirubinemia  -LFTs are improving.  Presented with bilirubin of 18.1.  Bilirubin is 16.2 today.  AST slightly elevated compared to yesterday at 858 but ALT is slightly improving..  INR is only 1.4 today.  Patient is alert and awake and  oriented. -Right upper quadrant ultrasound showed mildly dilated CBD which could be normal postcholecystectomy -Hepatitis panel showed positive hepatitis A IgM hence patient probably has acute hepatitis A.  GI evaluation appreciated.  Recommended supportive care.  GI has cleared the patient for discharge with repeat CMP to be done next week as an outpatient.  Outpatient follow-up with GI.  Chronic acetaminophen and salicylate use with concern for overdose.  -Patient has been on aspirin for 7 to 8 years and has been recently taking Goody powders  -Presented with acetaminophen level of 18 and salicylate level of 15.4. -Poison control recommended serial monitoring of BMP and salicylate levels and daily liver function test.  She was started on IV fluids and N-acetylcysteine as per poison control recommendations. -Subsequent Tylenol level is less than 10.  Poison control subsequently recommended to DC N-acetylcysteine. -Recommend decreased use of acetaminophen and salicylate.  Nausea and vomiting with epigastric discomfort -Probably from above.  Improving.    Tolerating diet.  GI has discontinued Protonix.  Subclinical hypothyroidism  -TSH 4.858 on admission with free T4 0.64.  Outpatient follow-up  History of DVT - has been on aspirin for treatment of DVT for the last 7 to 8 years after birth of her son.  Hold aspirin for now.  Outpatient follow-up with PCP/Dr. Myna Hidalgo   Fibromyalgia Migraine headaches -Avoid excessive medications with Tylenol.  Outpatient follow-up  Discharge Instructions  Discharge Instructions    Ambulatory referral to Gastroenterology   Complete by: As directed    With repeat LFTs   Diet general   Complete by: As directed    Increase activity slowly  Complete by: As directed      Allergies as of 06/24/2019      Reactions   Penicillins Nausea And Vomiting   Tramadol Itching      Medication List    STOP taking these medications   acetaminophen 500 MG  tablet Commonly known as: TYLENOL   BC HEADACHE POWDER PO   benzonatate 100 MG capsule Commonly known as: TESSALON   ibuprofen 800 MG tablet Commonly known as: ADVIL   loratadine 10 MG tablet Commonly known as: Claritin   SUMAtriptan 100 MG tablet Commonly known as: IMITREX     TAKE these medications   albuterol 108 (90 Base) MCG/ACT inhaler Commonly known as: VENTOLIN HFA Inhale 1-2 puffs into the lungs every 6 (six) hours as needed for wheezing or shortness of breath.   E-Z Spacer inhaler Use as instructed   ondansetron 4 MG tablet Commonly known as: ZOFRAN Take 1 tablet (4 mg total) by mouth every 6 (six) hours as needed for nausea.      Follow-up Information    Iva BoopGessner, Carl E, MD Follow up on 06/28/2019.   Specialty: Gastroenterology Why: Go to lab in basement and have liver tests checked. Lab open 730-5. Contact information: [address] [place] [address] [phone]        Ardyth GalYbanez, Jane, MD. Schedule an appointment as soon as possible for a visit in 1 week(s).   Specialty: Internal Medicine Contact information: [address] [facility] [address] [phone]          Allergies  Allergen Reactions  . Penicillins Nausea And Vomiting  . Tramadol Itching    Consultations: GI  Procedures/Studies: [place] Abdomen Limited  Result Date: 06/22/2019 CLINICAL DATA:  Abdominal pain EXAM: ULTRASOUND ABDOMEN LIMITED RIGHT UPPER QUADRANT COMPARISON:  None available FINDINGS: Gallbladder: History of cholecystectomy Common bile duct: Diameter: 7 mm.  Where visualized, no filling defect. Liver: No focal lesion identified. Within normal limits in parenchymal echogenicity. Portal vein is patent on color Doppler imaging with normal direction of blood flow towards the liver. IMPRESSION: Mildly dilated common bile duct that is usually reservoir effect after cholecystectomy. Follow-up could be obtained depending  on trend of liver function tests. Electronically Signed   By: Marnee SpringJonathon  Watts M.D.   On: 06/22/2019 06:54       Subjective: Patient seen and examined at bedside.  She feels much better and is tolerating diet.  No overnight nausea or vomiting.  Feels that she is ready to go home.  Discharge Exam: Vitals:   06/23/19 1952 06/24/19 0403  BP: 100/70 110/78  Pulse: 75 61  Resp: 20 20  Temp: 98.4 F (36.9 C) 98 F (36.7 C)  SpO2: 98% 96%    General: Pt is alert, awake, not in acute distress.  Still icteric. Cardiovascular: rate controlled, S1/S2 + Respiratory: bilateral decreased breath sounds at bases Abdominal: Soft, NT, ND, bowel sounds + Extremities: no edema, no cyanosis    The results of significant diagnostics from this hospitalization (including imaging, microbiology, ancillary and laboratory) are listed below for reference.     Microbiology: Recent Results (from the past 240 hour(s))  SARS Coronavirus 2 (CEPHEID - Performed in [facility] hospital lab), Hosp Order     Status: None   Collection Time: 06/22/19  4:10 AM   Specimen: Nasopharyngeal Swab  Result Value Ref Range Status   SARS Coronavirus 2 NEGATIVE NEGATIVE Final    Comment: (NOTE) If result is NEGATIVE SARS-CoV-2  target nucleic acids are NOT DETECTED. The SARS-CoV-2 RNA is generally detectable in upper and lower  respiratory specimens during the acute phase of infection. The lowest  concentration of SARS-CoV-2 viral copies this assay can detect is 250  copies / mL. A negative result does not preclude SARS-CoV-2 infection  and should not be used as the sole basis for treatment or other  patient management decisions.  A negative result may occur with  improper specimen collection / handling, submission of specimen other  than nasopharyngeal swab, presence of viral mutation(s) within the  areas targeted by this assay, and inadequate number of viral copies  (<250 copies / mL). A negative result must be  combined with clinical  observations, patient history, and epidemiological information. If result is POSITIVE SARS-CoV-2 target nucleic acids are DETECTED. The SARS-CoV-2 RNA is generally detectable in upper and lower  respiratory specimens dur ing the acute phase of infection.  Positive  results are indicative of active infection with SARS-CoV-2.  Clinical  correlation with patient history and other diagnostic information is  necessary to determine patient infection status.  Positive results do  not rule out bacterial infection or co-infection with other viruses. If result is PRESUMPTIVE POSTIVE SARS-CoV-2 nucleic acids MAY BE PRESENT.   A presumptive positive result was obtained on the submitted specimen  and confirmed on repeat testing.  While 2019 novel coronavirus  (SARS-CoV-2) nucleic acids may be present in the submitted sample  additional confirmatory testing may be necessary for epidemiological  and / or clinical management purposes  to differentiate between  SARS-CoV-2 and other Sarbecovirus currently known to infect humans.  If clinically indicated additional testing with an alternate test  methodology [phone]) is advised. The SARS-CoV-2 RNA is generally  detectable in upper and lower respiratory sp ecimens during the acute  phase of infection. The expected result is Negative. Fact Sheet for Patients:  StrictlyIdeas.no Fact Sheet for Healthcare Providers: BankingDealers.co.za This test is not yet approved or cleared by the [place] FDA and has been authorized for detection and/or diagnosis of SARS-CoV-2 by FDA under an Emergency Use Authorization (EUA).  This EUA will remain in effect (meaning this test can be used) for the duration of the COVID-19 declaration under Section 564(b)(1) of the Act, 21 U.S.C. section 360bbb-3(b)(1), unless the authorization is terminated or revoked sooner. Performed at [facility], [place] [address], [place], [place] [address]      Labs: BNP (last 3 results) No results for input(s): BNP in the last 8760 hours. Basic Metabolic Panel: Recent Labs  Lab 06/22/19 1815 06/22/19 2046 06/22/19 2344 06/23/19 0608 06/24/19 0245  NA 136 136 137 139 136  K 3.5 4.1 3.7 4.4 4.0  CL 105 105 106 108 107  CO2 23 23 20* 23 23  GLUCOSE 101* 80 99 80 81  BUN <5* <5* <5* <5* <5*  CREATININE 0.64 0.70 0.61 0.77 0.80  CALCIUM 8.3* 8.6* 8.7* 8.6* 8.6*  MG  --   --   --   --  1.7   Liver Function Tests: Recent Labs  Lab 06/22/19 0355 06/23/19 0608 06/24/19 0245  AST 883* 760* 858*  ALT 620* 492* 481*  ALKPHOS 129* 99 104  BILITOT 18.1* 14.9* 16.2*  PROT 8.4* 6.5 6.5  ALBUMIN 2.8* 2.1* 2.0*   No results for input(s): LIPASE, AMYLASE in the last 168 hours. Recent Labs  Lab 06/22/19 0856  AMMONIA 36*   CBC: Recent Labs  Lab 06/22/19 0355 06/22/19  0410 06/23/19 0608 06/24/19 0245  WBC 7.9  --  5.7 5.6  NEUTROABS 4.5  --   --  2.6  HGB 11.5* 13.3 11.3* 11.3*  HCT 34.2* 39.0 32.1* 32.3*  MCV 87.7  --  84.3 84.8  PLT 364  --  313 333   Cardiac Enzymes: No results for input(s): CKTOTAL, CKMB, CKMBINDEX, TROPONINI in the last 168 hours. BNP: Invalid input(s): POCBNP CBG: No results for input(s): GLUCAP in the last 168 hours. D-Dimer No results for input(s): DDIMER in the last 72 hours. Hgb A1c No results for input(s): HGBA1C in the last 72 hours. Lipid Profile No results for input(s): CHOL, HDL, LDLCALC, TRIG, CHOLHDL, LDLDIRECT in the last 72 hours. Thyroid function studies Recent Labs    06/22/19 0517  TSH 4.858*   Anemia work up No results for input(s): VITAMINB12, FOLATE, FERRITIN, TIBC, IRON, RETICCTPCT in the last 72 hours. Urinalysis    Component Value Date/Time   COLORURINE AMBER (A) 06/22/2019 0401   APPEARANCEUR CLEAR 06/22/2019 0401   LABSPEC 1.002 (L) 06/22/2019 0401   PHURINE 6.0 06/22/2019 0401   GLUCOSEU NEGATIVE 06/22/2019 0401    HGBUR NEGATIVE 06/22/2019 0401   BILIRUBINUR NEGATIVE 06/22/2019 0401   KETONESUR NEGATIVE 06/22/2019 0401   PROTEINUR NEGATIVE 06/22/2019 0401   UROBILINOGEN 0.2 09/29/2014 1625   NITRITE NEGATIVE 06/22/2019 0401   LEUKOCYTESUR NEGATIVE 06/22/2019 0401   Sepsis Labs Invalid input(s): PROCALCITONIN,  WBC,  LACTICIDVEN Microbiology Recent Results (from the past 240 hour(s))  SARS Coronavirus 2 (CEPHEID - Performed in [facility] hospital lab), Hosp Order     Status: None   Collection Time: 06/22/19  4:10 AM   Specimen: Nasopharyngeal Swab  Result Value Ref Range Status   SARS Coronavirus 2 NEGATIVE NEGATIVE Final    Comment: (NOTE) If result is NEGATIVE SARS-CoV-2 target nucleic acids are NOT DETECTED. The SARS-CoV-2 RNA is generally detectable in upper and lower  respiratory specimens during the acute phase of infection. The lowest  concentration of SARS-CoV-2 viral copies this assay can detect is 250  copies / mL. A negative result does not preclude SARS-CoV-2 infection  and should not be used as the sole basis for treatment or other  patient management decisions.  A negative result may occur with  improper specimen collection / handling, submission of specimen other  than nasopharyngeal swab, presence of viral mutation(s) within the  areas targeted by this assay, and inadequate number of viral copies  (<250 copies / mL). A negative result must be combined with clinical  observations, patient history, and epidemiological information. If result is POSITIVE SARS-CoV-2 target nucleic acids are DETECTED. The SARS-CoV-2 RNA is generally detectable in upper and lower  respiratory specimens dur ing the acute phase of infection.  Positive  results are indicative of active infection with SARS-CoV-2.  Clinical  correlation with patient history and other diagnostic information is  necessary to determine patient infection status.  Positive results do  not rule out bacterial infection  or co-infection with other viruses. If result is PRESUMPTIVE POSTIVE SARS-CoV-2 nucleic acids MAY BE PRESENT.   A presumptive positive result was obtained on the submitted specimen  and confirmed on repeat testing.  While 2019 novel coronavirus  (SARS-CoV-2) nucleic acids may be present in the submitted sample  additional confirmatory testing may be necessary for epidemiological  and / or clinical management purposes  to differentiate between  SARS-CoV-2 and other Sarbecovirus currently known to infect humans.  If clinically indicated additional testing with  an alternate test  methodology [phone]([id number]) is advised. The SARS-CoV-2 RNA is generally  detectable in upper and lower respiratory sp ecimens during the acute  phase of infection. The expected result is Negative. Fact Sheet for Patients:  BoilerBrush.com.cyhttps://www.fda.gov/media/136312/download Fact Sheet for Healthcare Providers: https://pope.com/https://www.fda.gov/media/136313/download This test is not yet approved or cleared by the [place] FDA and has been authorized for detection and/or diagnosis of SARS-CoV-2 by FDA under an Emergency Use Authorization (EUA).  This EUA will remain in effect (meaning this test can be used) for the duration of the COVID-19 declaration under Section 564(b)(1) of the Act, 21 U.S.C. section 360bbb-3(b)(1), unless the authorization is terminated or revoked sooner. Performed at [facility], [address] [place], [place] [address]      Time coordinating discharge: 35 minutes  SIGNED:   Glade LloydKshitiz Sejla Marzano, MD  [facility] 06/24/2019, 9:27 AM

## 2019-07-12 ENCOUNTER — Ambulatory Visit: Payer: Self-pay | Admitting: Gastroenterology

## 2019-08-03 ENCOUNTER — Ambulatory Visit: Payer: Self-pay | Admitting: Gastroenterology

## 2019-08-28 ENCOUNTER — Other Ambulatory Visit: Payer: Self-pay

## 2019-08-28 DIAGNOSIS — R21 Rash and other nonspecific skin eruption: Secondary | ICD-10-CM | POA: Diagnosis present

## 2019-08-28 DIAGNOSIS — M79605 Pain in left leg: Secondary | ICD-10-CM | POA: Insufficient documentation

## 2019-08-28 DIAGNOSIS — R233 Spontaneous ecchymoses: Secondary | ICD-10-CM | POA: Insufficient documentation

## 2019-08-28 DIAGNOSIS — N3000 Acute cystitis without hematuria: Secondary | ICD-10-CM | POA: Diagnosis not present

## 2019-08-28 DIAGNOSIS — M79604 Pain in right leg: Secondary | ICD-10-CM | POA: Diagnosis not present

## 2019-08-28 DIAGNOSIS — R2243 Localized swelling, mass and lump, lower limb, bilateral: Secondary | ICD-10-CM | POA: Diagnosis not present

## 2019-08-28 DIAGNOSIS — F17210 Nicotine dependence, cigarettes, uncomplicated: Secondary | ICD-10-CM | POA: Diagnosis not present

## 2019-08-28 DIAGNOSIS — E039 Hypothyroidism, unspecified: Secondary | ICD-10-CM | POA: Insufficient documentation

## 2019-08-28 NOTE — ED Triage Notes (Signed)
Pt arrives ambulatory from home; says she was admitted to [facility] mid July for Tylenol overdose; a few weeks later she began having intermittent swelling to bilateral lower extremities, worse on the left side; pt also with scattered round red rash to lower legs; pt actually marked both areas to watch-rash has not spread higher on legs since 0149am Saturday morning; pt says rash itches sometimes; pt also noted to have yellow sclera to both eyes that pt says has been there since mid July-color is actually improving;

## 2019-08-29 ENCOUNTER — Emergency Department: Payer: Medicaid Other

## 2019-08-29 ENCOUNTER — Emergency Department
Admission: EM | Admit: 2019-08-29 | Discharge: 2019-08-29 | Disposition: A | Discharge status: Home / Self Care | Payer: Medicaid Other | Attending: Emergency Medicine | Admitting: Emergency Medicine

## 2019-08-29 ENCOUNTER — Encounter: Payer: Self-pay | Admitting: Emergency Medicine

## 2019-08-29 DIAGNOSIS — R233 Spontaneous ecchymoses: Secondary | ICD-10-CM

## 2019-08-29 DIAGNOSIS — N3000 Acute cystitis without hematuria: Secondary | ICD-10-CM

## 2019-08-29 LAB — CBC WITH DIFFERENTIAL/PLATELET
Abs Immature Granulocytes: 0.06 10*3/uL (ref 0.00–0.07)
Basophils Absolute: 0 10*3/uL (ref 0.0–0.1)
Basophils Relative: 1 %
Eosinophils Absolute: 0.6 10*3/uL — ABNORMAL HIGH (ref 0.0–0.5)
Eosinophils Relative: 9 %
HCT: 32.1 % — ABNORMAL LOW (ref 36.0–46.0)
Hemoglobin: 11 g/dL — ABNORMAL LOW (ref 12.0–15.0)
Immature Granulocytes: 1 %
Lymphocytes Relative: 37 %
Lymphs Abs: 2.4 10*3/uL (ref 0.7–4.0)
MCH: 34.2 pg — ABNORMAL HIGH (ref 26.0–34.0)
MCHC: 34.3 g/dL (ref 30.0–36.0)
MCV: 99.7 fL (ref 80.0–100.0)
Monocytes Absolute: 0.5 10*3/uL (ref 0.1–1.0)
Monocytes Relative: 8 %
Neutro Abs: 2.9 10*3/uL (ref 1.7–7.7)
Neutrophils Relative %: 44 %
Platelets: 333 10*3/uL (ref 150–400)
RBC: 3.22 MIL/uL — ABNORMAL LOW (ref 3.87–5.11)
RDW: 16.9 % — ABNORMAL HIGH (ref 11.5–15.5)
WBC: 6.4 10*3/uL (ref 4.0–10.5)
nRBC: 0 % (ref 0.0–0.2)

## 2019-08-29 LAB — COMPREHENSIVE METABOLIC PANEL
ALT: 133 U/L — ABNORMAL HIGH (ref 0–44)
AST: 157 U/L — ABNORMAL HIGH (ref 15–41)
Albumin: 2.7 g/dL — ABNORMAL LOW (ref 3.5–5.0)
Alkaline Phosphatase: 244 U/L — ABNORMAL HIGH (ref 38–126)
Anion gap: 7 (ref 5–15)
BUN: 13 mg/dL (ref 6–20)
CO2: 27 mmol/L (ref 22–32)
Calcium: 8.7 mg/dL — ABNORMAL LOW (ref 8.9–10.3)
Chloride: 101 mmol/L (ref 98–111)
Creatinine, Ser: 0.85 mg/dL (ref 0.44–1.00)
GFR calc Af Amer: >60 mL/min (ref >60)
GFR calc non Af Amer: >60 mL/min (ref >60)
Glucose, Bld: 94 mg/dL (ref 70–99)
Potassium: 3.3 mmol/L — ABNORMAL LOW (ref 3.5–5.1)
Sodium: 135 mmol/L (ref 135–145)
Total Bilirubin: 4.1 mg/dL — ABNORMAL HIGH (ref 0.3–1.2)
Total Protein: 8.4 g/dL — ABNORMAL HIGH (ref 6.5–8.1)

## 2019-08-29 LAB — URINALYSIS, COMPLETE (UACMP) WITH MICROSCOPIC
Bilirubin Urine: NEGATIVE
Glucose, UA: NEGATIVE mg/dL
Hgb urine dipstick: NEGATIVE
Ketones, ur: NEGATIVE mg/dL
Leukocytes,Ua: NEGATIVE
Nitrite: POSITIVE — AB
Protein, ur: NEGATIVE mg/dL
Specific Gravity, Urine: 1.014 (ref 1.005–1.030)
pH: 5 (ref 5.0–8.0)

## 2019-08-29 LAB — C-REACTIVE PROTEIN: CRP: 3.3 mg/dL — ABNORMAL HIGH (ref <1.0)

## 2019-08-29 LAB — APTT: aPTT: 28 seconds (ref 24–36)

## 2019-08-29 LAB — PROTIME-INR
INR: 1 (ref 0.8–1.2)
Prothrombin Time: 13.5 seconds (ref 11.4–15.2)

## 2019-08-29 LAB — SEDIMENTATION RATE: Sed Rate: 107 mm/hr — ABNORMAL HIGH (ref 0–20)

## 2019-08-29 MED ORDER — NITROFURANTOIN MONOHYD MACRO 100 MG PO CAPS
100.0000 mg | ORAL_CAPSULE | Freq: Once | ORAL | Status: AC
  Administered 2019-08-29: 100 mg via ORAL
  Filled 2019-08-29: qty 1

## 2019-08-29 MED ORDER — NITROFURANTOIN MONOHYD MACRO 100 MG PO CAPS: 100.0000 mg | ORAL_CAPSULE | Freq: Two times a day (BID) | ORAL | 0 refills | Status: AC

## 2019-08-29 NOTE — ED Provider Notes (Signed)
[facility] Emergency Department Provider Note  ____________________________________________  Time seen: Approximately 1:01 AM  I have reviewed the triage vital signs and the nursing notes.   HISTORY  Chief Complaint Leg Swelling   HPI Barbara Moses is a 33 y.o. female with a history of hepatitis C, DVT not on anticoagulation, accidental Tylenol overdose in July 2020, hypothyroidism who presents for evaluation of bilateral leg pain and rash.  Her symptoms started 3 days ago.  She is noticed swelling to both of her legs.  She also has a nonblanching rash mostly in the legs, circumferential.  She reports that the rash is pruritic in nature.  She denies pain in her legs, abdominal pain, nausea vomiting, fever.  She has not been taking any over-the-counter medications.  She denies IV drug use.  She denies history of autoimmune disease.   Past Medical History:  Diagnosis Date  . Acute viral hepatitis A 06/22/2019  . Anemia   . Anxiety   . BV (bacterial vaginosis)   . Cervix, short (affecting pregnancy) 03/28/2011  . Common migraine 01/18/2015  . DVT (deep venous thrombosis) ([place])   . Family history of DVT 12/17/2011  . Fibromyalgia   . Gallstones   . Hypothyroidism   . Thyroid condition     Patient Active Problem List   Diagnosis Date Noted  . Acute viral hepatitis A 06/22/2019  . History of DVT (deep vein thrombosis) 06/22/2019  . Epigastric discomfort 06/22/2019  . Nausea & vomiting 06/22/2019  . Subclinical hypothyroidism 06/22/2019  . Fibromyalgia 01/18/2015  . Common migraine 01/18/2015  . DVT (deep vein thrombosis) in pregnancy 12/17/2011  . Family history of DVT 12/17/2011  . Anemia 06/26/2011  . Heart burn 06/26/2011    Past Surgical History:  Procedure Laterality Date  . CHOLECYSTECTOMY, LAPAROSCOPIC    . WISDOM TOOTH EXTRACTION      Prior to Admission medications   Medication Sig Start Date End Date Taking? Authorizing Provider   albuterol (PROVENTIL HFA;VENTOLIN HFA) 108 (90 BASE) MCG/ACT inhaler Inhale 1-2 puffs into the lungs every 6 (six) hours as needed for wheezing or shortness of breath. Patient not taking: Reported on 06/22/2019 08/18/15   Palumbo, April, MD  nitrofurantoin, macrocrystal-monohydrate, (MACROBID) 100 MG capsule Take 1 capsule (100 mg total) by mouth 2 (two) times daily for 5 days. 08/29/19 09/03/19  Rudene Re, MD  ondansetron (ZOFRAN) 4 MG tablet Take 1 tablet (4 mg total) by mouth every 6 (six) hours as needed for nausea. 06/24/19   Aline August, MD  Spacer/Aero-Holding Chambers (E-Z SPACER) inhaler Use as instructed 08/18/15   Palumbo, April, MD    Allergies Penicillins and Tramadol  Family History  Problem Relation Age of Onset  . Fibroids Mother   . Diabetes Father   . Crohn's disease Father   . Cancer Maternal Grandmother     Social History Social History   Tobacco Use  . Smoking status: Current Every Day Smoker    Packs/day: 0.50    Years: 7.00    Pack years: 3.50    Types: Cigarettes  . Smokeless tobacco: Never Used  Substance Use Topics  . Alcohol use: No  . Drug use: No    Review of Systems  Constitutional: Negative for fever. Eyes: Negative for visual changes. ENT: Negative for sore throat. Neck: No neck pain  Cardiovascular: Negative for chest pain. Respiratory: Negative for shortness of breath. Gastrointestinal: Negative for abdominal pain, vomiting or diarrhea. Genitourinary: Negative for dysuria. Musculoskeletal:  Negative for back pain. + b/l leg swelling Skin: + b/l leg rash. Neurological: Negative for headaches, weakness or numbness. Psych: No SI or HI  ____________________________________________   PHYSICAL EXAM:  VITAL SIGNS: ED Triage Vitals  Enc Vitals Group     BP 08/29/19 0003 115/77     Pulse Rate 08/29/19 0003 (!) 113     Resp 08/29/19 0003 16     Temp 08/29/19 0003 99.5 F (37.5 C)     Temp Source 08/29/19 0003 Oral     SpO2  08/29/19 0003 98 %     Weight 08/29/19 0005 122 lb (55.3 kg)     Height 08/29/19 0005 5' 2.5" (1.588 m)     Head Circumference --      Peak Flow --      Pain Score 08/29/19 0005 0     Pain Loc --      Pain Edu? --      Excl. in [place]? --     Constitutional: Alert and oriented. Well appearing and in no apparent distress. HEENT:      Head: Normocephalic and atraumatic.         Eyes: Conjunctivae are normal. Sclera is mildly icteric.       Mouth/Throat: Mucous membranes are moist.       Neck: Supple with no signs of meningismus. Cardiovascular: Regular rate and rhythm. No murmurs, gallops, or rubs. 2+ symmetrical distal pulses are present in all extremities. No JVD. Respiratory: Normal respiratory effort. Lungs are clear to auscultation bilaterally. No wheezes, crackles, or rhonchi.  Gastrointestinal: Soft, non tender, and non distended with positive bowel sounds. No rebound or guarding. Musculoskeletal: Trace pitting edema of b/l LE with 1+ pitting edema of the feet, strong DP and PT pulses Neurologic: Normal speech and language. Face is symmetric. Moving all extremities. No gross focal neurologic deficits are appreciated. Skin: Skin is warm, dry and intact. Petechial rash from the ankle to the knee bilaterally, circumferential. Few larger petechial plaques on the upper thighs. Patient has a linear cut off where her socks were where the rash on the legs stops. In the inner calcaneus region bilaterally she has a few more petechiae which have appeared after she scratched her feet. No mucoasl involvement Psychiatric: Mood and affect are normal. Speech and behavior are normal.  ____________________________________________   LABS (all labs ordered are listed, but only abnormal results are displayed)  Labs Reviewed  CBC WITH DIFFERENTIAL/PLATELET - Abnormal; Notable for the following components:      Result Value   RBC 3.22 (*)    Hemoglobin 11.0 (*)    HCT 32.1 (*)    MCH 34.2 (*)    RDW  16.9 (*)    Eosinophils Absolute 0.6 (*)    All other components within normal limits  COMPREHENSIVE METABOLIC PANEL - Abnormal; Notable for the following components:   Potassium 3.3 (*)    Calcium 8.7 (*)    Total Protein 8.4 (*)    Albumin 2.7 (*)    AST 157 (*)    ALT 133 (*)    Alkaline Phosphatase 244 (*)    Total Bilirubin 4.1 (*)    All other components within normal limits  SEDIMENTATION RATE - Abnormal; Notable for the following components:   Sed Rate 107 (*)    All other components within normal limits  C-REACTIVE PROTEIN - Abnormal; Notable for the following components:   CRP 3.3 (*)    All other components within normal limits  URINALYSIS, COMPLETE (UACMP) WITH MICROSCOPIC - Abnormal; Notable for the following components:   Color, Urine YELLOW (*)    APPearance HAZY (*)    Nitrite POSITIVE (*)    Bacteria, UA MANY (*)    All other components within normal limits  URINE CULTURE  PROTIME-INR  APTT   ____________________________________________  EKG  none  ____________________________________________  RADIOLOGY  I have personally reviewed the images performed during this visit and I agree with the Radiologist's read.   Interpretation by Radiologist:  US Venous Img Lower Bilateral  Result Date: 08/29/2019 CLINICAL DATA:  Initial evaluation for leg swelling, history of prior DVT. EXAM: BILATERAL LOWER EXTREMITY VENOUS DOPPLER ULTRASOUND TECHNIQUE: Gray-scale sonography with graded compression, as well as color Doppler and duplex ultrasound were performed to evaluate the lower extremity deep venous systems from the level of the common femoral vein and including the common femoral, femoral, profunda femoral, popliteal and calf veins including the posterior tibial, peroneal and gastrocnemius veins when visible. The superficial great saphenous vein was also interrogated. Spectral Doppler was utilized to evaluate flow at rest and with distal augmentation maneuvers in the  common femoral, femoral and popliteal veins. COMPARISON:  None. FINDINGS: RIGHT LOWER EXTREMITY Common Femoral Vein: No evidence of thrombus. Normal compressibility, respiratory phasicity and response to augmentation. Saphenofemoral Junction: No evidence of thrombus. Normal compressibility and flow on color Doppler imaging. Profunda Femoral Vein: No evidence of thrombus. Normal compressibility and flow on color Doppler imaging. Femoral Vein: No evidence of thrombus. Normal compressibility, respiratory phasicity and response to augmentation. Popliteal Vein: No evidence of thrombus. Normal compressibility, respiratory phasicity and response to augmentation. Calf Veins: No evidence of thrombus. Normal compressibility and flow on color Doppler imaging. Superficial Great Saphenous Vein: No evidence of thrombus. Normal compressibility. Venous Reflux:  None. Other Findings:  None. LEFT LOWER EXTREMITY Common Femoral Vein: No evidence of thrombus. Normal compressibility, respiratory phasicity and response to augmentation. Saphenofemoral Junction: No evidence of thrombus. Normal compressibility and flow on color Doppler imaging. Profunda Femoral Vein: No evidence of thrombus. Normal compressibility and flow on color Doppler imaging. Femoral Vein: No evidence of thrombus. Normal compressibility, respiratory phasicity and response to augmentation. Popliteal Vein: No evidence of thrombus. Normal compressibility, respiratory phasicity and response to augmentation. Calf Veins: No evidence of thrombus. Normal compressibility and flow on color Doppler imaging. Superficial Great Saphenous Vein: No evidence of thrombus. Normal compressibility. Venous Reflux:  None. Other Findings:  None. IMPRESSION: No evidence of deep venous thrombosis in either lower extremity. Electronically Signed   By: Jeannine Boga M.D.   On: 08/29/2019 02:00      ____________________________________________   PROCEDURES  Procedure(s) performed:  None Procedures Critical Care performed:  None ____________________________________________   INITIAL IMPRESSION / ASSESSMENT AND PLAN / ED COURSE   33 y.o. female with a history of hepatitis C, DVT not on anticoagulation, accidental Tylenol overdose in July 2020, hypothyroidism who presents for evaluation of bilateral leg pain and rash.  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  Labs and [place] ordered.   _________________________ 4:01 AM on 08/29/2019 -----------------------------------------  Labs showing improving kidney function, improving albumin levels, normal CBC, normal platelets, normal PT, INR, PTT.  ESR is elevated.  CRP is pending.  Venous ultrasound negative for DVT.  UA positive for UTI but with no blood.  At this time her presentation is most likely due to a vasculitis process.  Patient does not seem to have  any other organ involvement with no abdominal pain, no hematochezia, no vomiting or diarrhea, no arthralgias, no hematuria.  Patient will be referred to [facility] clinic rheumatology for further evaluation.  Patient was started on Macrobid for her UTI.  Discussed my standard return precautions and close follow-up.       As part of my medical decision making, I reviewed the following data within the [facility] notes reviewed and incorporated, Labs reviewed , Old chart reviewed, Radiograph reviewed , Notes from prior ED visits and [place] Controlled Substance Database   Patient was evaluated in Emergency Department today for the symptoms described in the history of present illness. Patient was evaluated in the context of the global COVID-19 pandemic, which necessitated consideration that the patient might be at risk for infection with the SARS-CoV-2 virus that causes COVID-19. Institutional protocols and algorithms that pertain to the evaluation of patients at risk for COVID-19  are in a state of rapid change based on information released by regulatory bodies including the CDC and federal and state organizations. These policies and algorithms were followed during the patient's care in the ED.   ____________________________________________   FINAL CLINICAL IMPRESSION(S) / ED DIAGNOSES   Final diagnoses:  Petechial rash  Acute cystitis without hematuria      NEW MEDICATIONS STARTED DURING THIS VISIT:  ED Discharge Orders         Ordered    nitrofurantoin, macrocrystal-monohydrate, (MACROBID) 100 MG capsule  2 times daily     08/29/19 0406           Note:  This document was prepared using Dragon voice recognition software and may include unintentional dictation errors.    Alfred Levins, [place], MD 08/29/19 [phone]

## 2019-08-29 NOTE — Discharge Instructions (Signed)
Please follow-up with rheumatology as soon as possible for further evaluation.  Return to the emergency room if you have blood in your stool, blood in your urine, abdominal pain, progression of the rash, pain in your joints, fever, or any other symptoms concerning to you.

## 2019-08-31 LAB — URINE CULTURE: Culture: >=100000 — AB

## 2019-09-01 NOTE — Progress Notes (Addendum)
ED Antimicrobial Stewardship Positive Culture Follow Up   Barbara Moses is an 33 y.o. female who presented to [facility] on 08/29/2019 with a chief complaint of  Chief Complaint  Patient presents with  . Leg Swelling    Recent Results (from the past 720 hour(s))  Urine Culture     Status: Abnormal   Collection Time: 08/29/19  2:58 AM   Specimen: Urine, Random  Result Value Ref Range Status   Specimen Description   Final    URINE, RANDOM Performed at [facility], [address] [place], [place] [address]    Special Requests   Final    NONE Performed at [facility], [address] [place], [place] [address]    Culture >=100,000 COLONIES/mL ESCHERICHIA COLI (A)  Final   Report Status 08/31/2019 FINAL  Final   Organism ID, Bacteria ESCHERICHIA COLI (A)  Final      Susceptibility   Escherichia coli - MIC*    AMPICILLIN <=2 SENSITIVE Sensitive     CEFAZOLIN <=4 SENSITIVE Sensitive     CEFTRIAXONE <=1 SENSITIVE Sensitive     CIPROFLOXACIN >=4 RESISTANT Resistant     GENTAMICIN <=1 SENSITIVE Sensitive     IMIPENEM <=0.25 SENSITIVE Sensitive     NITROFURANTOIN 128 RESISTANT Resistant     TRIMETH/SULFA <=20 SENSITIVE Sensitive     AMPICILLIN/SULBACTAM <=2 SENSITIVE Sensitive     PIP/TAZO <=4 SENSITIVE Sensitive     Extended ESBL NEGATIVE Sensitive     * >=100,000 COLONIES/mL ESCHERICHIA COLI    [x]  Treated with nitrofurantoin, organism resistant to prescribed antimicrobial  The patient was seen here at [facility] for a presumed vasculitis but found to have a suspicious UA and prescribed nitrofurantion. I have personally spoken with [name] and she continues to have problems with her vasculitis symptoms but expresses no s/s of a UTI (flank pain, pain or burning with urination, hematuria).   I reviewed the above information with Dr Jacqualine Code and called in a prescription for Bactrim DS one tablet twice daily for 5 days to [facility] on [address], [place].  Multiple attempts were made to contact the patient by telephone with no answer. I emailed the patient letting her know that I called in the prescription.  Dallie Piles, PharmD 09/01/2019, 1:41 PM Clinical Pharmacist Monday - Friday phone -  [phone] Saturday - Sunday phone - [phone]

## 2021-04-25 ENCOUNTER — Encounter (HOSPITAL_BASED_OUTPATIENT_CLINIC_OR_DEPARTMENT_OTHER): Payer: Self-pay | Admitting: Emergency Medicine

## 2021-04-25 ENCOUNTER — Emergency Department (HOSPITAL_BASED_OUTPATIENT_CLINIC_OR_DEPARTMENT_OTHER)
Admission: EM | Admit: 2021-04-25 | Discharge: 2021-04-25 | Disposition: A | Discharge status: Home / Self Care | Payer: Medicaid Other | Attending: Emergency Medicine | Admitting: Emergency Medicine

## 2021-04-25 ENCOUNTER — Other Ambulatory Visit (HOSPITAL_BASED_OUTPATIENT_CLINIC_OR_DEPARTMENT_OTHER): Payer: Self-pay

## 2021-04-25 ENCOUNTER — Other Ambulatory Visit: Payer: Self-pay

## 2021-04-25 ENCOUNTER — Emergency Department (HOSPITAL_BASED_OUTPATIENT_CLINIC_OR_DEPARTMENT_OTHER): Payer: Medicaid Other

## 2021-04-25 DIAGNOSIS — S61412A Laceration without foreign body of left hand, initial encounter: Secondary | ICD-10-CM | POA: Insufficient documentation

## 2021-04-25 DIAGNOSIS — E039 Hypothyroidism, unspecified: Secondary | ICD-10-CM | POA: Diagnosis not present

## 2021-04-25 DIAGNOSIS — W25XXXA Contact with sharp glass, initial encounter: Secondary | ICD-10-CM | POA: Insufficient documentation

## 2021-04-25 DIAGNOSIS — F17210 Nicotine dependence, cigarettes, uncomplicated: Secondary | ICD-10-CM | POA: Diagnosis not present

## 2021-04-25 DIAGNOSIS — Z23 Encounter for immunization: Secondary | ICD-10-CM | POA: Diagnosis not present

## 2021-04-25 DIAGNOSIS — S6992XA Unspecified injury of left wrist, hand and finger(s), initial encounter: Secondary | ICD-10-CM | POA: Diagnosis present

## 2021-04-25 MED ORDER — LIDOCAINE HCL (PF) 1 % IJ SOLN
10.0000 mL | Freq: Once | INTRAMUSCULAR | Status: AC
  Administered 2021-04-25: 10 mL
  Filled 2021-04-25: qty 10

## 2021-04-25 MED ORDER — CEPHALEXIN 500 MG PO CAPS: 500.0000 mg | ORAL_CAPSULE | Freq: Four times a day (QID) | ORAL | 0 refills | Status: DC

## 2021-04-25 MED ORDER — HYDROCODONE-ACETAMINOPHEN 5-325 MG PO TABS
1.0000 | ORAL_TABLET | Freq: Once | ORAL | Status: AC
  Administered 2021-04-25: 1 via ORAL
  Filled 2021-04-25: qty 1

## 2021-04-25 MED ORDER — CEPHALEXIN 500 MG PO CAPS
500.0000 mg | ORAL_CAPSULE | Freq: Four times a day (QID) | ORAL | 0 refills | Status: DC
  Filled 2021-04-25: qty 20, 5d supply, fill #0

## 2021-04-25 MED ORDER — TETANUS-DIPHTH-ACELL PERTUSSIS 5-2.5-18.5 LF-MCG/0.5 IM SUSY
0.5000 mL | PREFILLED_SYRINGE | Freq: Once | INTRAMUSCULAR | Status: AC
  Administered 2021-04-25: 0.5 mL via INTRAMUSCULAR
  Filled 2021-04-25: qty 0.5

## 2021-04-25 NOTE — Discharge Instructions (Addendum)
As discussed, you will need your sutures removed in 10-14 days. I have included the number of the hand surgeon. Call to schedule an appointment for further evaluation. I am sending you home with an antibiotics. Take as prescribed and finish all antibiotics. You may take over the counter ibuprofen or tylenol as needed for pain. Return to the ER for new or worsening symptoms.

## 2021-04-25 NOTE — ED Triage Notes (Signed)
Pt cut herself on a piece of glass laying on the ground when she tripped, fell and landed on it.  Pt removed glass from hand, bleeding controlled by pressure dressing at this time.

## 2021-04-25 NOTE — ED Provider Notes (Signed)
[facility] EMERGENCY DEPARTMENT Provider Note   CSN: [id number] Arrival date & time: 04/25/21  8756     History Chief Complaint  Patient presents with  . Laceration    hand    Barbara Moses is a 35 y.o. female with a past medical history significant for DVT, fibromyalgia, and hypothyroidism who presents to the ED due to laceration to palmar aspect of left hand on thenar eminence.  Patient states she was camping last night and woke up to use the bathroom around 3-4AM when she tripped and fell on a piece of glass. No head injury or loss of consciousness.  Patient is unsure when her last tetanus shot was. Pain is worse with palpation and movement of thumb. Patient is right-hand dominant. No treatment prior to arrival.   History obtained from patient and past medical records. No interpreter used during encounter.       Past Medical History:  Diagnosis Date  . Acute viral hepatitis A 06/22/2019  . Anemia   . Anxiety   . BV (bacterial vaginosis)   . Cervix, short (affecting pregnancy) 03/28/2011  . Common migraine 01/18/2015  . DVT (deep venous thrombosis) (HCC)   . Family history of DVT 12/17/2011  . Fibromyalgia   . Gallstones   . Hypothyroidism   . Thyroid condition     Patient Active Problem List   Diagnosis Date Noted  . Acute viral hepatitis A 06/22/2019  . History of DVT (deep vein thrombosis) 06/22/2019  . Epigastric discomfort 06/22/2019  . Nausea & vomiting 06/22/2019  . Subclinical hypothyroidism 06/22/2019  . Fibromyalgia 01/18/2015  . Common migraine 01/18/2015  . DVT (deep vein thrombosis) in pregnancy 12/17/2011  . Family history of DVT 12/17/2011  . Anemia 06/26/2011  . Heart burn 06/26/2011    Past Surgical History:  Procedure Laterality Date  . CHOLECYSTECTOMY, LAPAROSCOPIC    . WISDOM TOOTH EXTRACTION       OB History    Gravida  2   Para  2   Term  1   Preterm  1   AB  0   Living  2     SAB  0   IAB  0   Ectopic  0    Multiple  0   Live Births  1           Family History  Problem Relation Age of Onset  . Fibroids Mother   . Diabetes Father   . Crohn's disease Father   . Cancer Maternal Grandmother     Social History   Tobacco Use  . Smoking status: Current Every Day Smoker    Packs/day: 0.50    Years: 7.00    Pack years: 3.50    Types: Cigarettes  . Smokeless tobacco: Never Used  Substance Use Topics  . Alcohol use: No  . Drug use: No    Home Medications Prior to Admission medications   Medication Sig Start Date End Date Taking? Authorizing Provider  cephALEXin (KEFLEX) 500 MG capsule Take 1 capsule (500 mg total) by mouth 4 (four) times daily. 04/25/21  Yes Jayron Maqueda C, PA-C  albuterol (PROVENTIL HFA;VENTOLIN HFA) 108 (90 BASE) MCG/ACT inhaler Inhale 1-2 puffs into the lungs every 6 (six) hours as needed for wheezing or shortness of breath. Patient not taking: Reported on 06/22/2019 08/18/15   Palumbo, April, MD  ondansetron (ZOFRAN) 4 MG tablet Take 1 tablet (4 mg total) by mouth every 6 (six) hours as needed for  nausea. 06/24/19   Glade Lloyd, MD  Spacer/Aero-Holding Chambers (E-Z SPACER) inhaler Use as instructed 08/18/15   Palumbo, April, MD    Allergies    Penicillins and Tramadol  Review of Systems   Review of Systems  Skin: Positive for wound.  Neurological: Negative for numbness.  All other systems reviewed and are negative.   Physical Exam Updated Vital Signs BP 115/82 (BP Location: Right Arm)   Pulse 74   Temp 98.4 F (36.9 C) (Oral)   Resp 20   Ht 5\' 2"  (1.575 m)   Wt 56.7 kg   LMP 04/23/2021 (Exact Date)   SpO2 100%   BMI 22.86 kg/m   Physical Exam Vitals and nursing note reviewed.  Constitutional:      General: She is not in acute distress.    Appearance: She is not ill-appearing.  HENT:     Head: Normocephalic.  Eyes:     Pupils: Pupils are equal, round, and reactive to light.  Cardiovascular:     Rate and Rhythm: Normal rate and  regular rhythm.     Pulses: Normal pulses.     Heart sounds: Normal heart sounds. No murmur heard. No friction rub. No gallop.   Pulmonary:     Effort: Pulmonary effort is normal.     Breath sounds: Normal breath sounds.  Abdominal:     General: Abdomen is flat. There is no distension.     Palpations: Abdomen is soft.     Tenderness: There is no abdominal tenderness. There is no guarding or rebound.  Musculoskeletal:        General: Normal range of motion.     Cervical back: Neck supple.     Comments: Decreased ROM of left thumb due to pain. Radial pulse intact. Full ROM of all other fingers and wrist.   Skin:    General: Skin is warm and dry.     Comments: 3cm laceration to left thenar eminence. See photo below.  Neurological:     General: No focal deficit present.     Mental Status: She is alert.  Psychiatric:        Mood and Affect: Mood normal.        Behavior: Behavior normal.       ED Results / Procedures / Treatments   Labs (all labs ordered are listed, but only abnormal results are displayed) Labs Reviewed - No data to display  EKG None  Radiology DG Hand Complete Left  Result Date: 04/25/2021 CLINICAL DATA:  35 year old female status post laceration from glass. EXAM: LEFT HAND - COMPLETE 3+ VIEW COMPARISON:  Left hand series 07/18/2005. FINDINGS: Bone mineralization is within normal limits. Joint spaces and alignment remain normal. No osseous abnormality identified. No convincing radiopaque foreign body identified. No definite soft tissue gas. Dressing material over the thenar eminence. IMPRESSION: Negative; dressing material over the thenar eminence. Electronically Signed   By: 09/17/2005 M.D.   On: 04/25/2021 11:23    Procedures .05/21/2022Laceration Repair  Date/Time: 04/25/2021 1:36 PM Performed by: 04/27/2021, PA-C Authorized by: Mannie Stabile, PA-C   Consent:    Consent obtained:  Verbal   Consent given by:  Patient   Risks discussed:  Infection,  need for additional repair, pain, poor cosmetic result and poor wound healing   Alternatives discussed:  No treatment and delayed treatment Universal protocol:    Procedure explained and questions answered to patient or proxy's satisfaction: yes     Relevant documents present  and verified: yes     Test results available: yes     Imaging studies available: yes     Required blood products, implants, devices, and special equipment available: yes     Site/side marked: yes     Immediately prior to procedure, a time out was called: yes     Patient identity confirmed:  Verbally with patient Anesthesia:    Anesthesia method:  Local infiltration   Local anesthetic:  Lidocaine 1% w/o epi Laceration details:    Location:  Hand   Hand location:  L palm   Length (cm):  3   Depth (mm):  3 Pre-procedure details:    Preparation:  Patient was prepped and draped in usual sterile fashion and imaging obtained to evaluate for foreign bodies Exploration:    Limited defect created (wound extended): no     Hemostasis achieved with:  Direct pressure   Imaging obtained: x-ray     Imaging outcome: foreign body not noted     Wound exploration: wound explored through full range of motion and entire depth of wound visualized     Wound extent: no foreign bodies/material noted, no tendon damage noted and no underlying fracture noted     Contaminated: no   Treatment:    Area cleansed with:  Saline   Amount of cleaning:  Extensive   Irrigation solution:  Sterile saline   Irrigation volume:  250   Irrigation method:  Pressure wash   Visualized foreign bodies/material removed: no   Skin repair:    Repair method:  Sutures   Suture size:  3-0   Suture material:  Prolene   Number of sutures:  6 Approximation:    Approximation:  Close Repair type:    Repair type:  Intermediate Post-procedure details:    Dressing:  Non-adherent dressing and splint for protection   Procedure completion:  Tolerated well, no  immediate complications     Medications Ordered in ED Medications  Tdap (BOOSTRIX) injection 0.5 mL (has no administration in time range)  HYDROcodone-acetaminophen (NORCO/VICODIN) 5-325 MG per tablet 1 tablet (1 tablet Oral Given 04/25/21 1150)  lidocaine (PF) (XYLOCAINE) 1 % injection 10 mL (10 mLs Infiltration Given by Other 04/25/21 1150)    ED Course  I have reviewed the triage vital signs and the nursing notes.  Pertinent labs & imaging results that were available during my care of the patient were reviewed by me and considered in my medical decision making (see chart for details).    MDM Rules/Calculators/A&P                         35 year old female presents to the ED due to laceration to left hand after falling on glass early this morning.  No head injury or loss of consciousness.  Patient is right-hand dominant.  Upon arrival, vitals all within normal limits.  Patient in no acute distress and non-ill-appearing.  Physical exam significant for 3 cm laceration on inferior aspect of left thenar eminence. Prior to lidocaine, patient has decreased ROM of left thumb due to pain; however after lidocaine full ROM of left thumb. Low suspicion for tendon involvement. X-ray ordered at triage which I personally reviewed which is negative for any acute abnormalities. Wound thoroughly cleaned here in the ED. Suture repair as noted above.Tetanus updated. Patient given number of hand surgery at discharge and advised to call to schedule an appointment for further evaluation. Patient discharged with antibiotics due to contaminated  wound. Strict ED precautions discussed with patient. Patient states understanding and agrees to plan. Patient discharged home in no acute distress and stable vitals.  Discussed case with Dr. Madilyn Hook who evaluated patient at bedside and agrees with assessment and plan.  Final Clinical Impression(s) / ED Diagnoses Final diagnoses:  Laceration of left hand without foreign body,  initial encounter    Rx / DC Orders ED Discharge Orders         Ordered    cephALEXin (KEFLEX) 500 MG capsule  4 times daily        04/25/21 1339           Jesusita Oka 04/25/21 1340    Tilden Fossa, MD 04/25/21 [phone]

## 2021-04-25 NOTE — ED Notes (Signed)
Assessed patient in waiting area, noted to have laceration at left hand, base of left thumb, states she cut her hand on a piece of glass. dsg applied to laceration site, bleeding was controlled prior to dsg application, no numbness or tingling complaints noted from client.

## 2021-05-17 ENCOUNTER — Emergency Department (HOSPITAL_BASED_OUTPATIENT_CLINIC_OR_DEPARTMENT_OTHER)
Admission: EM | Admit: 2021-05-17 | Discharge: 2021-05-17 | Disposition: A | Discharge status: Home / Self Care | Payer: Medicaid Other | Attending: Emergency Medicine | Admitting: Emergency Medicine

## 2021-05-17 ENCOUNTER — Other Ambulatory Visit: Payer: Self-pay

## 2021-05-17 DIAGNOSIS — W25XXXD Contact with sharp glass, subsequent encounter: Secondary | ICD-10-CM | POA: Insufficient documentation

## 2021-05-17 DIAGNOSIS — Z4802 Encounter for removal of sutures: Secondary | ICD-10-CM | POA: Diagnosis not present

## 2021-05-17 DIAGNOSIS — E039 Hypothyroidism, unspecified: Secondary | ICD-10-CM | POA: Insufficient documentation

## 2021-05-17 DIAGNOSIS — S6992XD Unspecified injury of left wrist, hand and finger(s), subsequent encounter: Secondary | ICD-10-CM | POA: Diagnosis present

## 2021-05-17 DIAGNOSIS — S61412D Laceration without foreign body of left hand, subsequent encounter: Secondary | ICD-10-CM | POA: Diagnosis not present

## 2021-05-17 DIAGNOSIS — Y9301 Activity, walking, marching and hiking: Secondary | ICD-10-CM | POA: Diagnosis not present

## 2021-05-17 DIAGNOSIS — F17210 Nicotine dependence, cigarettes, uncomplicated: Secondary | ICD-10-CM | POA: Diagnosis not present

## 2021-05-17 NOTE — ED Triage Notes (Signed)
Here for sutures to be removed.  Report she is a little past due for removal.

## 2021-05-17 NOTE — ED Provider Notes (Signed)
[facility] EMERGENCY DEPARTMENT Provider Note   CSN: [id number] Arrival date & time: 05/17/21  5784     History Chief Complaint  Patient presents with   Wound Check    Barbara Moses is a 35 y.o. female.  35 y.o female here for suture removal.  Patient was ambulating, when she suddenly fell walking with a mason jar cutting her left hand.  She had sutures placed approximately over 2 weeks ago, 6 of them.  Reports she tried to come back to have these removed without any success.  No fevers, no drainage, no other complaints.  The history is provided by the patient and medical records.  Wound Check This is a new problem. The current episode started more than 1 week ago.      Past Medical History:  Diagnosis Date   Acute viral hepatitis A 06/22/2019   Anemia    Anxiety    BV (bacterial vaginosis)    Cervix, short (affecting pregnancy) 03/28/2011   Common migraine 01/18/2015   DVT (deep venous thrombosis) (HCC)    Family history of DVT 12/17/2011   Fibromyalgia    Gallstones    Hypothyroidism    Thyroid condition     Patient Active Problem List   Diagnosis Date Noted   Acute viral hepatitis A 06/22/2019   History of DVT (deep vein thrombosis) 06/22/2019   Epigastric discomfort 06/22/2019   Nausea & vomiting 06/22/2019   Subclinical hypothyroidism 06/22/2019   Fibromyalgia 01/18/2015   Common migraine 01/18/2015   DVT (deep vein thrombosis) in pregnancy 12/17/2011   Family history of DVT 12/17/2011   Anemia 06/26/2011   Heart burn 06/26/2011    Past Surgical History:  Procedure Laterality Date   CHOLECYSTECTOMY, LAPAROSCOPIC     WISDOM TOOTH EXTRACTION       OB History     Gravida  2   Para  2   Term  1   Preterm  1   AB  0   Living  2      SAB  0   IAB  0   Ectopic  0   Multiple  0   Live Births  1           Family History  Problem Relation Age of Onset   Fibroids Mother    Diabetes Father    Crohn's disease Father     Cancer Maternal Grandmother     Social History   Tobacco Use   Smoking status: Every Day    Packs/day: 0.50    Years: 7.00    Pack years: 3.50    Types: Cigarettes   Smokeless tobacco: Never  Substance Use Topics   Alcohol use: No   Drug use: No    Home Medications Prior to Admission medications   Medication Sig Start Date End Date Taking? Authorizing Provider  albuterol (PROVENTIL HFA;VENTOLIN HFA) 108 (90 BASE) MCG/ACT inhaler Inhale 1-2 puffs into the lungs every 6 (six) hours as needed for wheezing or shortness of breath. Patient not taking: Reported on 06/22/2019 08/18/15   Palumbo, April, MD  cephALEXin (KEFLEX) 500 MG capsule Take 1 capsule (500 mg total) by mouth 4 (four) times daily. 04/25/21   Mannie Stabile, PA-C  ondansetron (ZOFRAN) 4 MG tablet Take 1 tablet (4 mg total) by mouth every 6 (six) hours as needed for nausea. 06/24/19   Glade Lloyd, MD  Spacer/Aero-Holding Chambers (E-Z SPACER) inhaler Use as instructed 08/18/15   Nicanor Alcon, April, MD  Allergies    Penicillins and Tramadol  Review of Systems   Review of Systems  Constitutional:  Negative for fever.  Skin:  Positive for wound.   Physical Exam Updated Vital Signs BP 120/85 (BP Location: Right Arm)   Pulse 83   Temp 98.1 F (36.7 C) (Oral)   Resp 18   Ht 5' 2.5" (1.588 m)   Wt 56.7 kg   LMP 04/23/2021 (Exact Date)   SpO2 98%   BMI 22.50 kg/m   Physical Exam Vitals and nursing note reviewed.  Constitutional:      Appearance: Normal appearance.  HENT:     Head: Normocephalic and atraumatic.  Pulmonary:     Effort: Pulmonary effort is normal.  Abdominal:     General: Abdomen is flat.  Musculoskeletal:     Cervical back: Normal range of motion and neck supple.  Skin:    General: Skin is warm and dry.     Findings: Signs of injury present. No erythema.     Comments: Well-healed wound, noted with some scarring.  No drainage, no erythema, no skin changes.  Neurological:     Mental  Status: She is alert and oriented to person, place, and time.    ED Results / Procedures / Treatments   Labs (all labs ordered are listed, but only abnormal results are displayed) Labs Reviewed - No data to display  EKG None  Radiology No results found.  Procedures Procedures   Medications Ordered in ED Medications - No data to display  ED Course  I have reviewed the triage vital signs and the nursing notes.  Pertinent labs & imaging results that were available during my care of the patient were reviewed by me and considered in my medical decision making (see chart for details).    MDM Rules/Calculators/A&P     Patient presents here today for suture removal status post 2 weeks ago.  Reports she had a fall while holding in [place] s[place], slight laceration noted to the left hand, with 6 well-appearing stitches in place.  I personally removed these myself.  Wound is appearing, without any drainage, no erythema, no skin changes.  We discussed wound care at home.  Patient nurses and agrees to management, return precaution discussed at length.   Portions of this note were generated with Scientist, clinical (histocompatibility and immunogenetics). Dictation errors may occur despite best attempts at proofreading.  Final Clinical Impression(s) / ED Diagnoses Final diagnoses:  Visit for suture removal    Rx / DC Orders ED Discharge Orders     None        Claude Manges, Cordelia Poche 05/17/21 1023    Terrilee Files, MD 05/17/21 1754

## 2021-05-17 NOTE — Discharge Instructions (Addendum)
I have removed 6 stitches from your left hand, you may apply Neosporin or bacitracin to help with the scarring.  Please keep this wound away from the sun.

## 2021-09-30 ENCOUNTER — Ambulatory Visit
Admission: EM | Admit: 2021-09-30 | Discharge: 2021-09-30 | Discharge status: Absent without leave | Payer: Medicaid Other

## 2021-09-30 ENCOUNTER — Ambulatory Visit
Admission: EM | Admit: 2021-09-30 | Discharge: 2021-09-30 | Disposition: A | Discharge status: Home / Self Care | Payer: Medicaid Other | Attending: Emergency Medicine | Admitting: Emergency Medicine

## 2021-09-30 ENCOUNTER — Other Ambulatory Visit: Payer: Self-pay

## 2021-09-30 DIAGNOSIS — K047 Periapical abscess without sinus: Secondary | ICD-10-CM

## 2021-09-30 MED ORDER — CEFTRIAXONE SODIUM 1 G IJ SOLR
1.0000 g | Freq: Once | INTRAMUSCULAR | Status: AC
  Administered 2021-09-30: 1 g via INTRAMUSCULAR

## 2021-09-30 MED ORDER — MOXIFLOXACIN HCL 400 MG PO TABS: 400.0000 mg | ORAL_TABLET | Freq: Every day | ORAL | 0 refills | Status: AC

## 2021-09-30 MED ORDER — SULFAMETHOXAZOLE-TRIMETHOPRIM 800-160 MG PO TABS: 1.0000 | ORAL_TABLET | Freq: Two times a day (BID) | ORAL | 0 refills | Status: AC

## 2021-09-30 NOTE — ED Notes (Signed)
Attempt to call patient on phone and in lobby, no response.

## 2021-09-30 NOTE — ED Notes (Signed)
Attempt to call patient on phone and in lobby, no response. 

## 2021-09-30 NOTE — ED Provider Notes (Signed)
[facility]    CSN: [id number] Arrival date & time: 09/30/21  1348      History   Chief Complaint Chief Complaint  Patient presents with   Facial Swelling    HPI Barbara Moses is a 35 y.o. female.   Pt reports having swelling to right side of face, she denies difficulty breathing and also denies SOB.  Patient states she currently smokes cigarettes, 1/2 pack/day.  Patient states she has several cavities on her right lower jaw, states they have not bothered her until recently.  Patient states the swelling came on fairly quickly over the past 2 days.  She states that the swelling is painful, warm, patient is asking for relief.  Patient states she has tried some Tylenol with little relief, denies fever, aches, chills, right ear pain, right neck pain.  The history is provided by the patient.   Past Medical History:  Diagnosis Date   Acute viral hepatitis A 06/22/2019   Anemia    Anxiety    BV (bacterial vaginosis)    Cervix, short (affecting pregnancy) 03/28/2011   Common migraine 01/18/2015   DVT (deep venous thrombosis) (HCC)    Family history of DVT 12/17/2011   Fibromyalgia    Gallstones    Hypothyroidism    Thyroid condition     Patient Active Problem List   Diagnosis Date Noted   Acute viral hepatitis A 06/22/2019   History of DVT (deep vein thrombosis) 06/22/2019   Epigastric discomfort 06/22/2019   Nausea & vomiting 06/22/2019   Subclinical hypothyroidism 06/22/2019   Fibromyalgia 01/18/2015   Common migraine 01/18/2015   DVT (deep vein thrombosis) in pregnancy 12/17/2011   Family history of DVT 12/17/2011   Anemia 06/26/2011   Heart burn 06/26/2011    Past Surgical History:  Procedure Laterality Date   CHOLECYSTECTOMY, LAPAROSCOPIC     WISDOM TOOTH EXTRACTION      OB History     Gravida  2   Para  2   Term  1   Preterm  1   AB  0   Living  2      SAB  0   IAB  0   Ectopic  0   Multiple  0   Live Births  1             Home Medications    Prior to Admission medications   Medication Sig Start Date End Date Taking? Authorizing Provider  moxifloxacin (AVELOX) 400 MG tablet Take 1 tablet (400 mg total) by mouth daily at 8 pm for 14 days. 09/30/21 10/14/21 Yes Theadora Rama Scales, PA-C  sulfamethoxazole-trimethoprim (BACTRIM DS) 800-160 MG tablet Take 1 tablet by mouth 2 (two) times daily for 14 days. 09/30/21 10/14/21 Yes Theadora Rama Scales, PA-C  ondansetron (ZOFRAN) 4 MG tablet Take 1 tablet (4 mg total) by mouth every 6 (six) hours as needed for nausea. 06/24/19   Glade Lloyd, MD  Spacer/Aero-Holding Chambers (E-Z SPACER) inhaler Use as instructed 08/18/15   Palumbo, April, MD    Family History Family History  Problem Relation Age of Onset   Fibroids Mother    Diabetes Father    Crohn's disease Father    Cancer Maternal Grandmother     Social History Social History   Tobacco Use   Smoking status: Every Day    Packs/day: 0.50    Years: 7.00    Pack years: 3.50    Types: Cigarettes   Smokeless tobacco: Never  Substance Use Topics   Alcohol use: No   Drug use: No     Allergies   Penicillins and Tramadol   Review of Systems Review of Systems Pertinent findings noted in history of present illness.    Physical Exam Triage Vital Signs ED Triage Vitals  Enc Vitals Group     BP      Pulse      Resp      Temp      Temp src      SpO2      Weight      Height      Head Circumference      Peak Flow      Pain Score      Pain Loc      Pain Edu?      Excl. in GC?    No data found.  Updated Vital Signs BP 107/78 (BP Location: Right Arm)   Pulse 89   Temp 98.1 F (36.7 C)   Resp 18   LMP 09/16/2021 (Approximate)   SpO2 98%   Visual Acuity Right Eye Distance:   Left Eye Distance:   Bilateral Distance:    Right Eye Near:   Left Eye Near:    Bilateral Near:     Physical Exam Vitals and nursing note reviewed.  Constitutional:      General: She is not in  acute distress.    Appearance: Normal appearance. She is not ill-appearing.  HENT:     Head: Normocephalic and atraumatic.     Salivary Glands: Right salivary gland is not diffusely enlarged or tender. Left salivary gland is not diffusely enlarged or tender.     Right Ear: Tympanic membrane, ear canal and external ear normal. No drainage. No middle ear effusion. There is no impacted cerumen. Tympanic membrane is not erythematous or bulging.     Left Ear: Tympanic membrane, ear canal and external ear normal. No drainage.  No middle ear effusion. There is no impacted cerumen. Tympanic membrane is not erythematous or bulging.     Nose: Nose normal. No nasal deformity, septal deviation, mucosal edema, congestion or rhinorrhea.     Right Turbinates: Not enlarged, swollen or pale.     Left Turbinates: Not enlarged, swollen or pale.     Right Sinus: No maxillary sinus tenderness or frontal sinus tenderness.     Left Sinus: No maxillary sinus tenderness or frontal sinus tenderness.     Mouth/Throat:     Lips: Pink. No lesions.     Mouth: Mucous membranes are moist. No oral lesions.     Dentition: Dental caries present.     Pharynx: Oropharynx is clear. Uvula midline. No posterior oropharyngeal erythema or uvula swelling.     Tonsils: No tonsillar exudate. 0 on the right. 0 on the left.     Comments: Significant swelling of right lower jaw which is indurated, erythematous, warm to touch and very tender to palpation. Eyes:     General: Lids are normal.        Right eye: No discharge.        Left eye: No discharge.     Extraocular Movements: Extraocular movements intact.     Conjunctiva/sclera: Conjunctivae normal.     Right eye: Right conjunctiva is not injected.     Left eye: Left conjunctiva is not injected.  Neck:     Trachea: Trachea and phonation normal.  Cardiovascular:     Rate and Rhythm: Normal rate and  regular rhythm.     Pulses: Normal pulses.     Heart sounds: Normal heart sounds.  No murmur heard.   No friction rub. No gallop.  Pulmonary:     Effort: Pulmonary effort is normal. No accessory muscle usage, prolonged expiration or respiratory distress.     Breath sounds: Normal breath sounds. No stridor, decreased air movement or transmitted upper airway sounds. No decreased breath sounds, wheezing, rhonchi or rales.  Chest:     Chest wall: No tenderness.  Musculoskeletal:        General: Normal range of motion.     Cervical back: Normal range of motion and neck supple. Normal range of motion.  Lymphadenopathy:     Cervical: No cervical adenopathy.  Skin:    General: Skin is warm and dry.     Findings: No erythema or rash.  Neurological:     General: No focal deficit present.     Mental Status: She is alert and oriented to person, place, and time.  Psychiatric:        Mood and Affect: Mood normal.        Behavior: Behavior normal.     UC Treatments / Results  Labs (all labs ordered are listed, but only abnormal results are displayed) Labs Reviewed - No data to display  EKG   Radiology No results found.  Procedures Procedures (including critical care time)  Medications Ordered in UC Medications  cefTRIAXone (ROCEPHIN) injection 1 g (1 g Intramuscular Given 09/30/21 1559)    Initial Impression / Assessment and Plan / UC Course  I have reviewed the triage vital signs and the nursing notes.  Pertinent labs & imaging results that were available during my care of the patient were reviewed by me and considered in my medical decision making (see chart for details).     Dental abscess.  We will treat patient empirically and aggressively for cellulitis of face with acute onset with ceftriaxone injection today followed by a 14-day course of moxifloxacin and Bactrim, patient reports IgE mediated penicillin allergy.  Return precautions provided to patient patient also strongly encouraged to follow-up with a dentist to address the tooth as it will just become  infected again if it is not either repaired or removed.  Patient verbalized understanding and agreement of plan as discussed.  All questions were addressed during visit.  Please see discharge instructions below for further details of plan.  Final Clinical Impressions(s) / UC Diagnoses   Final diagnoses:  Dental abscess     Discharge Instructions      You received an injection of ceftriaxone in the office today.  This should expedite treatment of the abscess in your right lower jaw.  I also recommend that you complete a 14-day course of 2 antibiotics to completely eradicate the bacteria causing the infection, moxifloxacin 400 mg 1 tab daily and Bactrim 1 tablet twice daily.  Please be sure you follow-up with a dentist as soon as possible for evaluation of the tooth that is the most likely cause of the abscess.  Please go to the emergency room for emergent evaluation and treatment if these 3 antibiotics do not give you relief for the next 3 days.     ED Prescriptions     Medication Sig Dispense Auth. Provider   moxifloxacin (AVELOX) 400 MG tablet Take 1 tablet (400 mg total) by mouth daily at 8 pm for 14 days. 14 tablet Theadora Rama Scales, PA-C   sulfamethoxazole-trimethoprim (BACTRIM DS) 800-160 MG  tablet Take 1 tablet by mouth 2 (two) times daily for 14 days. 28 tablet Theadora Rama Scales, PA-C      PDMP not reviewed this encounter.   Theadora Rama Scales, PA-C 10/01/21 1840

## 2021-09-30 NOTE — ED Triage Notes (Signed)
Pt reports having swelling to right side of face, she denies difficulty breathing and also denies SOB.

## 2021-09-30 NOTE — Discharge Instructions (Addendum)
You received an injection of ceftriaxone in the office today.  This should expedite treatment of the abscess in your right lower jaw.  I also recommend that you complete a 14-day course of 2 antibiotics to completely eradicate the bacteria causing the infection, moxifloxacin 400 mg 1 tab daily and Bactrim 1 tablet twice daily.  Please be sure you follow-up with a dentist as soon as possible for evaluation of the tooth that is the most likely cause of the abscess.  Please go to the emergency room for emergent evaluation and treatment if these 3 antibiotics do not give you relief for the next 3 days.

## 2022-07-02 ENCOUNTER — Ambulatory Visit
Admission: EM | Admit: 2022-07-02 | Discharge: 2022-07-02 | Disposition: A | Discharge status: Home / Self Care | Payer: Medicaid Other | Attending: Urgent Care | Admitting: Urgent Care

## 2022-07-02 ENCOUNTER — Encounter: Payer: Self-pay | Admitting: Emergency Medicine

## 2022-07-02 DIAGNOSIS — H9201 Otalgia, right ear: Secondary | ICD-10-CM

## 2022-07-02 DIAGNOSIS — H65191 Other acute nonsuppurative otitis media, right ear: Secondary | ICD-10-CM

## 2022-07-02 DIAGNOSIS — L249 Irritant contact dermatitis, unspecified cause: Secondary | ICD-10-CM | POA: Diagnosis not present

## 2022-07-02 DIAGNOSIS — W57XXXA Bitten or stung by nonvenomous insect and other nonvenomous arthropods, initial encounter: Secondary | ICD-10-CM

## 2022-07-02 MED ORDER — TRIAMCINOLONE ACETONIDE 0.1 % EX CREA: 1.0000 | TOPICAL_CREAM | Freq: Two times a day (BID) | CUTANEOUS | 0 refills | Status: AC

## 2022-07-02 MED ORDER — AMOXICILLIN-POT CLAVULANATE 875-125 MG PO TABS: 1.0000 | ORAL_TABLET | Freq: Two times a day (BID) | ORAL | 0 refills | Status: AC

## 2022-07-02 MED ORDER — PSEUDOEPHEDRINE HCL 60 MG PO TABS: 60.0000 mg | ORAL_TABLET | Freq: Three times a day (TID) | ORAL | 0 refills | Status: AC | PRN

## 2022-07-02 MED ORDER — CETIRIZINE HCL 10 MG PO TABS: 10.0000 mg | ORAL_TABLET | Freq: Every day | ORAL | 0 refills | Status: AC

## 2022-07-02 NOTE — ED Triage Notes (Signed)
Pt here with right ear pain and fullness since waking up this morning.

## 2022-07-02 NOTE — ED Provider Notes (Signed)
[facility]   MRN: [id number] DOB: 24-Jun-1986  Subjective:   Barbara Moses is a 36 y.o. female presenting for acute onset of persistent moderate to severe right ear pain with fullness.  She has actually been battling a cold for the past week that she got from her son.  No ear drainage, ear trauma.  Patient also suffered a tick bite 2 weeks ago and still has an irritated spot over the left torso.  No drainage, redness, tenderness.  No current facility-administered medications for this encounter.  Current Outpatient Medications:    ondansetron (ZOFRAN) 4 MG tablet, Take 1 tablet (4 mg total) by mouth every 6 (six) hours as needed for nausea., Disp: 20 tablet, Rfl: 0   Spacer/Aero-Holding Chambers (E-Z SPACER) inhaler, Use as instructed, Disp: 1 each, Rfl: 0   Allergies  Allergen Reactions   Penicillins Nausea And Vomiting   Tramadol Itching    Past Medical History:  Diagnosis Date   Acute viral hepatitis A 06/22/2019   Anemia    Anxiety    BV (bacterial vaginosis)    Cervix, short (affecting pregnancy) 03/28/2011   Common migraine 01/18/2015   DVT (deep venous thrombosis) (HCC)    Family history of DVT 12/17/2011   Fibromyalgia    Gallstones    Hypothyroidism    Thyroid condition      Past Surgical History:  Procedure Laterality Date   CHOLECYSTECTOMY, LAPAROSCOPIC     WISDOM TOOTH EXTRACTION      Family History  Problem Relation Age of Onset   Fibroids Mother    Diabetes Father    Crohn's disease Father    Cancer Maternal Grandmother     Social History   Tobacco Use   Smoking status: Every Day    Packs/day: 0.50    Years: 7.00    Total pack years: 3.50    Types: Cigarettes   Smokeless tobacco: Never  Substance Use Topics   Alcohol use: No   Drug use: No    ROS   Objective:   Vitals: BP 107/72   Pulse 92   Temp 98.6 F (37 C)   Resp 20   SpO2 98%   Physical Exam Constitutional:      General: She is not in acute  distress.    Appearance: Normal appearance. She is well-developed and normal weight. She is not ill-appearing, toxic-appearing or diaphoretic.  HENT:     Head: Normocephalic and atraumatic.     Right Ear: Ear canal and external ear normal. No drainage or tenderness. No middle ear effusion. There is no impacted cerumen. Tympanic membrane is erythematous and bulging. Tympanic membrane is not injected or perforated.     Left Ear: Tympanic membrane, ear canal and external ear normal. No drainage or tenderness.  No middle ear effusion. There is no impacted cerumen. Tympanic membrane is not injected, perforated, erythematous or bulging.     Nose: Congestion present. No rhinorrhea.     Mouth/Throat:     Mouth: Mucous membranes are moist. No oral lesions.     Pharynx: No pharyngeal swelling, oropharyngeal exudate, posterior oropharyngeal erythema or uvula swelling.     Tonsils: No tonsillar exudate or tonsillar abscesses.  Eyes:     General: No scleral icterus.       Right eye: No discharge.        Left eye: No discharge.     Extraocular Movements: Extraocular movements intact.     Right eye: Normal extraocular  motion.     Left eye: Normal extraocular motion.     Conjunctiva/sclera: Conjunctivae normal.  Cardiovascular:     Rate and Rhythm: Normal rate.  Pulmonary:     Effort: Pulmonary effort is normal.  Musculoskeletal:     Cervical back: Normal range of motion and neck supple.  Lymphadenopathy:     Cervical: No cervical adenopathy.  Skin:    General: Skin is warm and dry.       Neurological:     General: No focal deficit present.     Mental Status: She is alert and oriented to person, place, and time.  Psychiatric:        Mood and Affect: Mood normal.        Behavior: Behavior normal.     Assessment and Plan :   PDMP not reviewed this encounter.  1. Other non-recurrent acute nonsuppurative otitis media of right ear   2. Acute otalgia, right   3. Tick bite, initial encounter    4. Irritant contact dermatitis, unspecified trigger    Start Augmentin to cover for otitis media. Use supportive care otherwise.  Also recommended that she use topical steroid to help with contact dermatitis from her tick bite.  No signs of an infected wound.  Counseled patient on potential for adverse effects with medications prescribed/recommended today, ER and return-to-clinic precautions discussed, patient verbalized understanding.    Wallis Bamberg, PA-C 07/02/22 0930

## 2022-12-11 IMAGING — DX DG HAND COMPLETE 3+V*L*
3 series · 3 of 3 positions shown · non-contrast
Comparison: Left hand series 07/18/2005.

CLINICAL DATA: 34-year-old female status post laceration from
glass.

EXAM:
LEFT HAND - COMPLETE 3+ VIEW

[hand pa]
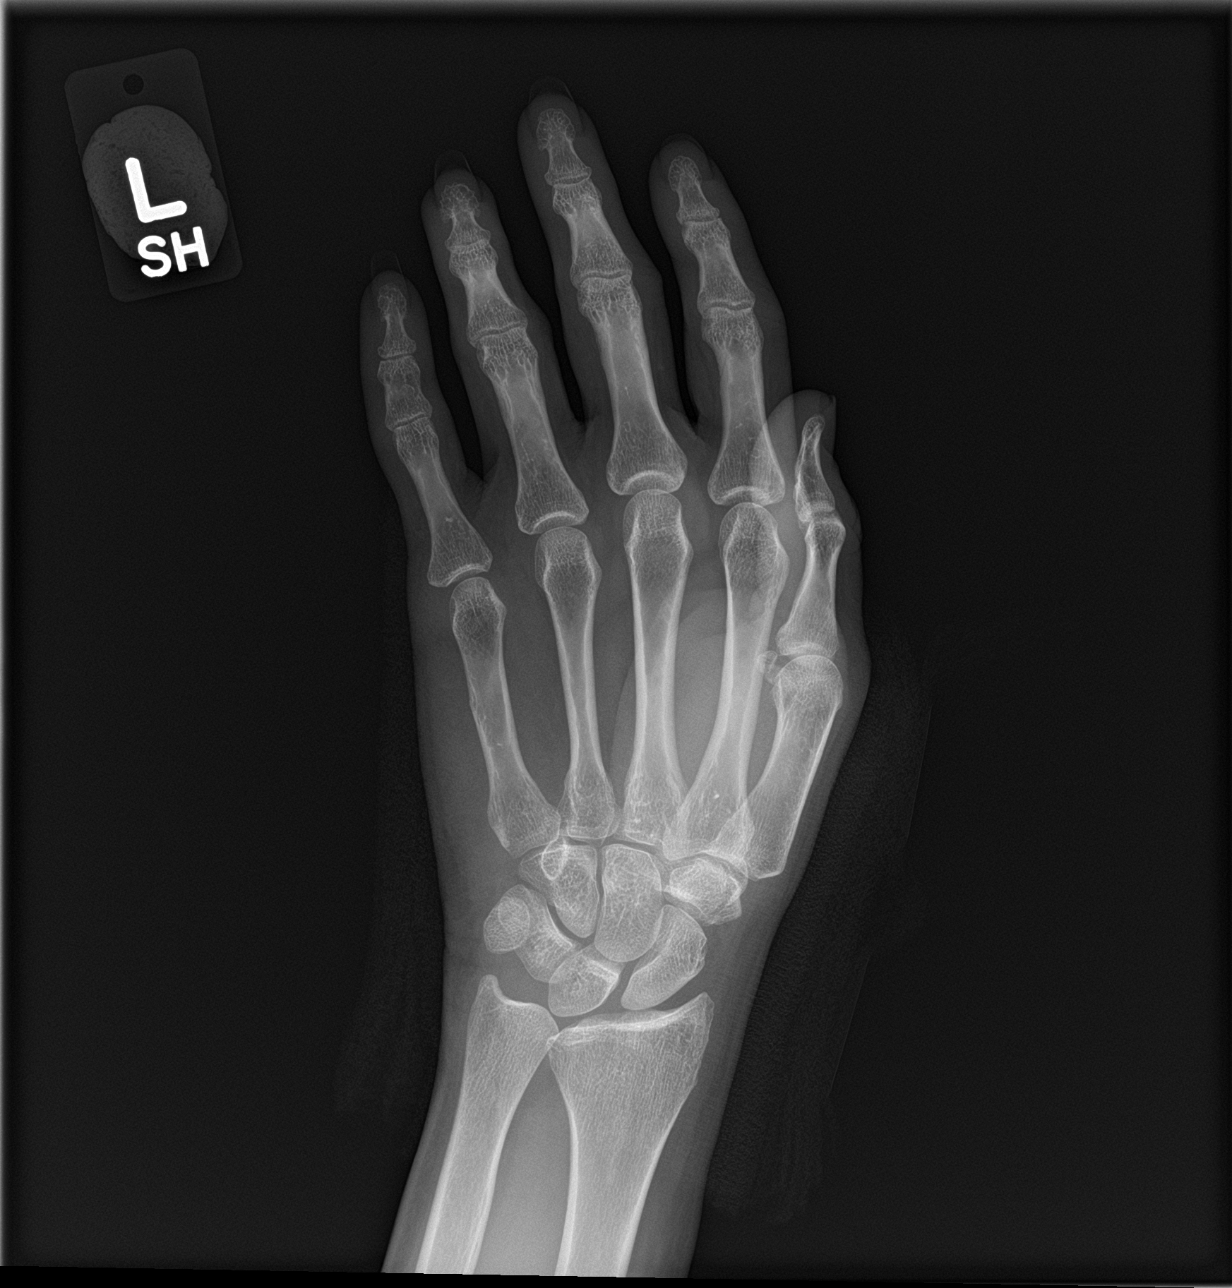

[hand obl]
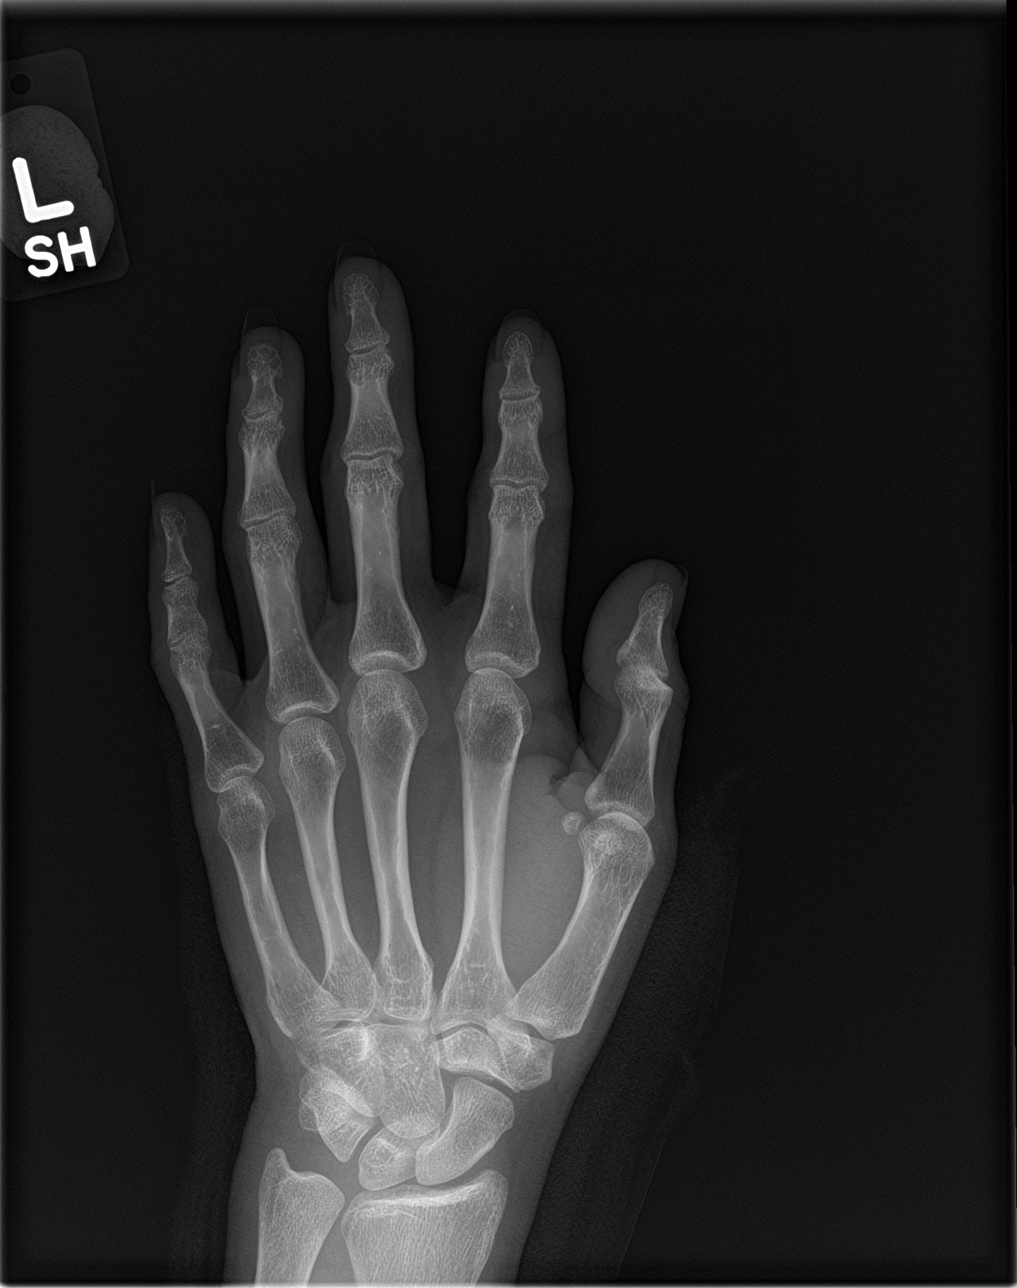

[hand lat]
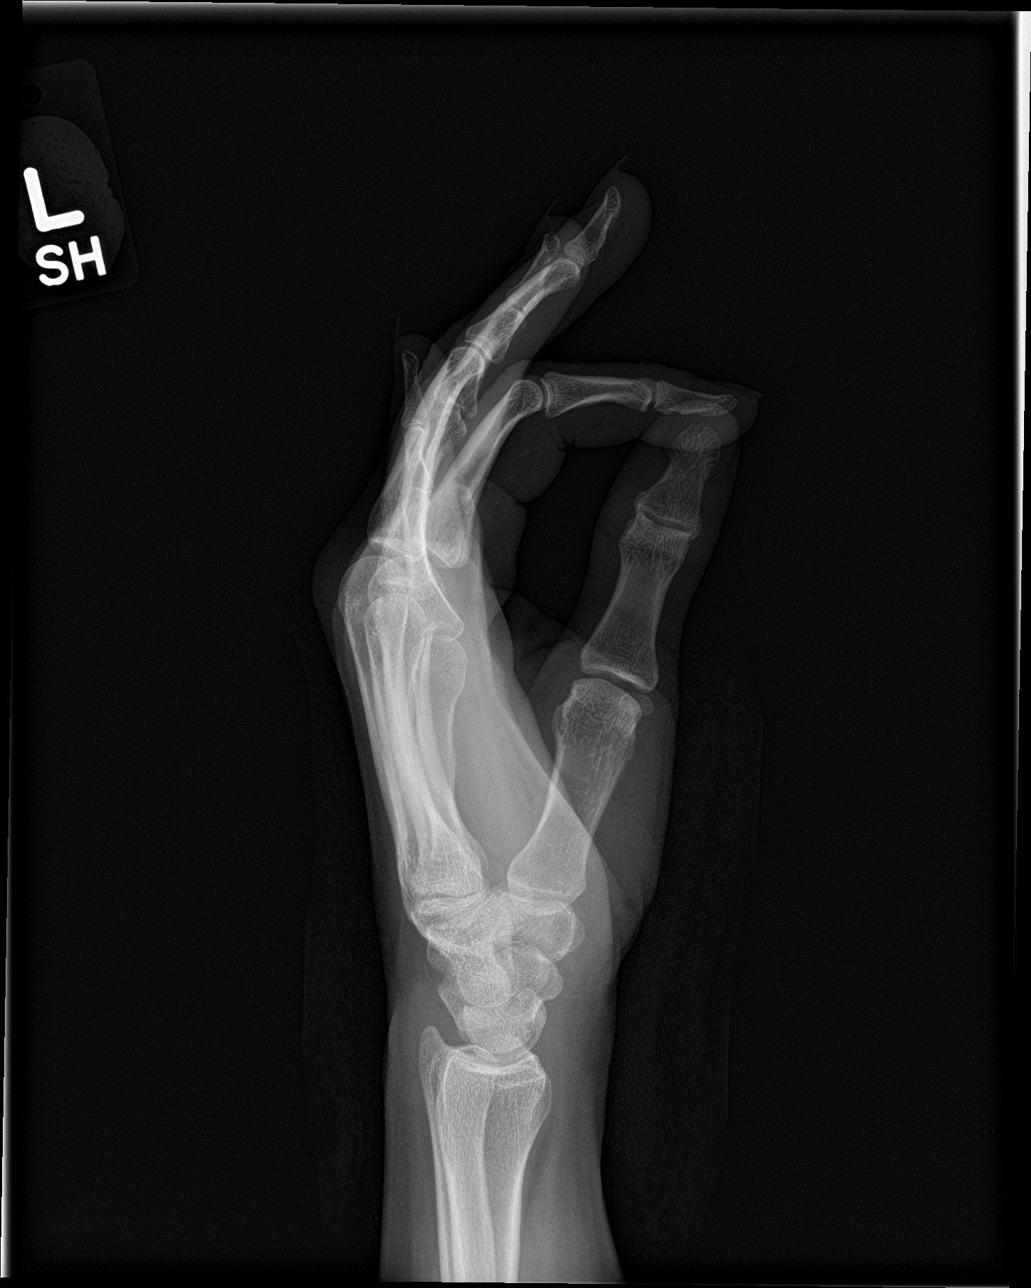

[3 of 3 positions shown; findings below may reference images not displayed]

FINDINGS: Bone mineralization is within normal limits. Joint spaces and
alignment remain normal. No osseous abnormality identified. No
convincing radiopaque foreign body identified. No definite soft
tissue gas. Dressing material over the thenar eminence.
IMPRESSION: Negative; dressing material over the thenar eminence.
